# Patient Record
Sex: Female | Born: 1960 | Race: Black or African American | Hispanic: No | Marital: Married | Smoking: Never smoker
Health system: Southern US, Community
[De-identification: ages and names within clinical notes are randomized; demographics above are authoritative.]

## PROBLEM LIST (undated history)

## (undated) DIAGNOSIS — Z923 Personal history of irradiation: Secondary | ICD-10-CM

## (undated) DIAGNOSIS — C50919 Malignant neoplasm of unspecified site of unspecified female breast: Secondary | ICD-10-CM

## (undated) DIAGNOSIS — C801 Malignant (primary) neoplasm, unspecified: Secondary | ICD-10-CM

## (undated) DIAGNOSIS — K219 Gastro-esophageal reflux disease without esophagitis: Secondary | ICD-10-CM

## (undated) DIAGNOSIS — I10 Essential (primary) hypertension: Secondary | ICD-10-CM

## (undated) HISTORY — DX: Personal history of irradiation: Z92.3

## (undated) HISTORY — PX: BREAST BIOPSY: SHX20

## (undated) HISTORY — PX: BREAST LUMPECTOMY: SHX2

## (undated) HISTORY — PX: BUNIONECTOMY: SHX129

## (undated) HISTORY — PX: ENDOMETRIAL ABLATION: SHX621

---

## 1999-03-18 ENCOUNTER — Other Ambulatory Visit: Admission: RE | Admit: 1999-03-18 | Discharge: 1999-03-18 | Payer: Self-pay | Admitting: *Deleted

## 2000-03-17 ENCOUNTER — Other Ambulatory Visit: Admission: RE | Admit: 2000-03-17 | Discharge: 2000-03-17 | Payer: Self-pay | Admitting: *Deleted

## 2001-04-01 ENCOUNTER — Other Ambulatory Visit: Admission: RE | Admit: 2001-04-01 | Discharge: 2001-04-01 | Payer: Self-pay | Admitting: *Deleted

## 2002-04-04 ENCOUNTER — Other Ambulatory Visit: Admission: RE | Admit: 2002-04-04 | Discharge: 2002-04-04 | Payer: Self-pay | Admitting: *Deleted

## 2003-12-27 ENCOUNTER — Other Ambulatory Visit: Admission: RE | Admit: 2003-12-27 | Discharge: 2003-12-27 | Payer: Self-pay | Admitting: Obstetrics and Gynecology

## 2005-01-21 ENCOUNTER — Emergency Department (HOSPITAL_COMMUNITY): Admission: EM | Admit: 2005-01-21 | Discharge: 2005-01-21 | Payer: Self-pay | Admitting: Emergency Medicine

## 2006-01-15 ENCOUNTER — Encounter: Admission: RE | Admit: 2006-01-15 | Discharge: 2006-01-15 | Payer: Self-pay | Admitting: Obstetrics and Gynecology

## 2006-12-01 ENCOUNTER — Ambulatory Visit (HOSPITAL_COMMUNITY): Admission: RE | Admit: 2006-12-01 | Discharge: 2006-12-01 | Payer: Self-pay | Admitting: Obstetrics and Gynecology

## 2006-12-01 ENCOUNTER — Encounter (INDEPENDENT_AMBULATORY_CARE_PROVIDER_SITE_OTHER): Payer: Self-pay | Admitting: *Deleted

## 2007-02-10 ENCOUNTER — Encounter: Admission: RE | Admit: 2007-02-10 | Discharge: 2007-02-10 | Payer: Self-pay | Admitting: Family Medicine

## 2007-06-30 ENCOUNTER — Observation Stay (HOSPITAL_COMMUNITY): Admission: EM | Admit: 2007-06-30 | Discharge: 2007-06-30 | Payer: Self-pay | Admitting: *Deleted

## 2008-02-28 ENCOUNTER — Encounter: Admission: RE | Admit: 2008-02-28 | Discharge: 2008-02-28 | Payer: Self-pay | Admitting: Obstetrics and Gynecology

## 2009-04-26 ENCOUNTER — Emergency Department (HOSPITAL_COMMUNITY): Admission: EM | Admit: 2009-04-26 | Discharge: 2009-04-26 | Payer: Self-pay | Admitting: Emergency Medicine

## 2009-05-08 ENCOUNTER — Encounter: Admission: RE | Admit: 2009-05-08 | Discharge: 2009-05-08 | Payer: Self-pay | Admitting: Family Medicine

## 2010-12-24 ENCOUNTER — Encounter
Admission: RE | Admit: 2010-12-24 | Discharge: 2010-12-24 | Payer: Self-pay | Source: Home / Self Care | Attending: Obstetrics | Admitting: Obstetrics

## 2011-04-29 NOTE — H&P (Signed)
Shelly Beasley, Shelly Beasley                 ACCOUNT NO.:  [id number]   MEDICAL RECORD NO.:  [id number]          PATIENT TYPE:  EMS   LOCATION:  ED                           FACILITY:  [facility]   PHYSICIAN:  Hollice Espy, M.D.DATE OF BIRTH:  Jul 28, 1961   DATE OF ADMISSION:  06/29/2007  DATE OF DISCHARGE:                              HISTORY & PHYSICAL   PRIMARY CARE PHYSICIAN:  Stacie Acres. White, M.D.   CHIEF COMPLAINT:  Chest pain.   HISTORY OF PRESENT ILLNESS:  Patient is a 50 year old African-American  female with essentially no past medical history, who presents to the  emergency room complaining of some chest pain.  She has had no previous  episodes, and this morning at approximately 10:30 to 11:00, she has  started having some problems with chest pain just over her right breast,  described as a combination of pressure and also some sharp pain.  It did  not effect her breathing.  It did not radiate.  It was about a 7/10.  It  did not go down her arm, go up to her neck, did not go to her back.  Again, it caused no associated breathing, no nausea or vomiting.  The  symptoms persisted all throughout the day during work and after they  persisted after work, she decided to come to the emergency room.  In the  emergency room, she had an EKG and enzymes checked, all of which were  unremarkable.  Chest x-ray showed no evidence of any ST-T wave changes.  Cardiac markers were unremarkable, as well.  However, the only thing  concerning was the patient's blood pressure, which has ranged during her  hospitalization anywhere from 155 to 181 systolic, to 95-118 diastolic.  The patient tells me that she has had no previous history of blood  pressure, although Dr. Cliffton Asters has been following her blood pressure in  the office and has said at times it has had its ups and downs.   She received some morphine which she said helped her a little bit.  She  noted no associated change with activity or position  but did notice that  when she used her right arm a lot, that seemed to change the pain in  nature.   Currently, she is doing well.  She complains of a mild headache which is  likely secondary to nitroglycerin that she received in the emergency  room but denies any visual changes or dysphagia.  She still has some  residual right-sided pain over her right breast but no palpitations, no  shortness of breath, wheezing, coughing.  No abdominal pain, no  hematuria, dysuria, constipation, diarrhea, focal extremity numbness,  weakness, or pain.  The review of systems is otherwise negative.   PAST MEDICAL HISTORY:  None.   MEDICATIONS:  She is on diethylpropion 75 mg p.o. daily for weight loss.  She takes no over-the-counter medications.  No multivitamins or [employer]  products.   She reports nausea to CODEINE, which causes nausea.   She denies any tobacco, alcohol, or illegal drug use.   FAMILY HISTORY:  Notable for diabetes and hypertension.   PHYSICAL EXAMINATION:  VITALS ON ADMISSION:  Temp 98.5, heart rate 81,  blood pressure 168/116.  Since then, her blood pressure has gone as high  as 181/104.  Last, it was 155/95.  Respirations were 20.  O2 sat were  99% on room air.  GENERAL:  Patient is alert and oriented x3.  No apparent distress.  HEENT:  Normocephalic and atraumatic.  Her mucous membranes are moist.  NECK:  She has no carotid bruits.  HEART:  Regular rate and rhythm.  S1 and S2.  LUNGS:  Clear to auscultation bilaterally.  ABDOMEN:  Soft, nontender, nondistended.  Positive bowel sounds.  EXTREMITIES:  No clubbing, cyanosis or edema.   LAB WORK:  EKG is reportedly a normal sinus rhythm.  No ST-T wave  elevation.   Sodium 136, potassium 2.5, chloride 104, bicarb 28, BUN 6, creatinine  0.8, glucose 89, white count 5.2.  H&H 13.4, 39.5.  MCV 84, platelet  count 211.  No shift differential.  A D-dimer is normal.  CPK 40.2, MB  less than 1.  Troponin I less than 0.05.   Chest  x-ray is unremarkable.   ASSESSMENT/PLAN:  1. Atypical chest pain:  The patient is of a young age, has no other      risk factors.  Given her history and physical exam, I would      normally be tempted to send her home but with her elevated blood      pressures and no previous history, I am going to at least observe      her overnight and check serial sets of cardiac markers.  We will      also check a urine drug screen.  If her cardiac markers stay within      normal limits and she has no further episodes, likely I will plan      to send her home with an outpatient stress test, although I will      defer to my hospitalist partner to do that.  2. Hypertension:  Will continue to follow.  I suspect some component      of her elevated blood pressures may be secondary to her dietary      supplement drug, which has been reported can cause tachycardia and      high blood pressure as well.  Nevertheless, we will plan to      continue to check her pressures and should they remain elevated,      would recommend that she hold off on her diet drug and have her      follow up with Dr. Cliffton Asters, who may want to start her on outpatient      blood pressure medication.  Again, will also order a urine drug      screen.      Hollice Espy, M.D.  Electronically Signed     SKK/MEDQ  D:  06/30/2007  T:  06/30/2007  Job:  [id number]   cc:   Stacie Acres. Cliffton Asters, M.D.  Fax: [phone]

## 2011-04-29 NOTE — Discharge Summary (Signed)
Shelly Beasley, Shelly Beasley                 ACCOUNT NO.:  [id number]   MEDICAL RECORD NO.:  [id number]          PATIENT TYPE:  INP   LOCATION:  1439                         [facility]ACILITY[facility]  [facility]   PHYSICIAN:  Barnetta Chapel, MDDATE OF BIRTH:  1961-02-27   DATE OF ADMISSION:  06/29/2007  DATE OF DISCHARGE:  06/30/2007                               DISCHARGE SUMMARY   PRIMARY CARE Lamiya Naas:  Stacie Acres. White, M.D.   DISCHARGE DIAGNOSES:  1. Chest pain, atypical, likely of musculoskeletal origin.  2. Hypertension.   DISCHARGE MEDICATIONS:  Zestoretic 20/25 one tablet p.o. once daily.   CONSULTATIONS DONE:  None.   BRIEF HISTORY AND HOSPITAL COURSE:  Please refer to the H&P done by Dr.  Hollice Espy, M.D. on June 29, 2007.  The patient is a 50 year old  female with no significant past medical history.  The patient presented  with atypical chest pain. He said it to be right sided.  It had no  radiation.  The patient does not have any significant risk factors for  ACS.  On admission, the patient's blood pressure was noted to be on the  high side.  The patient takes a diet pill, Diethylpropion.   The patient was admitted for observation.  The cardiac enzymes were  cycled.  The cardiac enzymes all came back negative.  The plan is for  the patient to have an outpatient cardiac stress test.  The chest pain  has also resolved.  The patient's blood pressure on discharge was 149/97  mmHg.  She will be discharged home on Zestoretic 20/25 one tablet p.o.  once daily.   DISCHARGE PLANS:  1. Send patient home on the above medications.  2. The patient to start taking the diet pill.  3. Cardiac diet.  4. Activity as tolerated.  5. Outpatient cardiac stress test.      Barnetta Chapel, MD  Electronically Signed     SIO/MEDQ  D:  06/30/2007  T:  06/30/2007  Job:  [id number]   cc:   Stacie Acres. Cliffton Asters, M.D.  Fax: [phone]

## 2011-05-02 NOTE — Op Note (Signed)
NAMEBRITT, Shelly Beasley                 ACCOUNT NO.:  [id number]   MEDICAL RECORD NO.:  [id number]          PATIENT TYPE:  AMB   LOCATION:  SDC                           FACILITY:  [facility]   PHYSICIAN:  Richardean Sale, M.D.   DATE OF BIRTH:  08/13/1961   DATE OF PROCEDURE:  12/01/2006  DATE OF DISCHARGE:                               OPERATIVE REPORT   PREOPERATIVE DIAGNOSIS:  Menorrhagia.   POSTOPERATIVE DIAGNOSIS:  Menorrhagia.   PROCEDURE:  Hysteroscopy D&C and NovaSure endometrial ablation.   SURGEON:  Dr. Richardean Sale   ASSISTANT:  None.   ANESTHESIA:  General and local.   COMPLICATIONS:  None.   BLOOD LOSS:  Minimal.   FLUID DEFICIT:  20 mL.   SPECIMENS:  Endometrial curettings sent to pathology.   FINDINGS:  Normal-appearing endometrial cavity, uterine sound length 10  cm, cervical length 5 cm, cavity length 5 cm, cavity width 3.5 cm, power  96, time 2 minutes.   INDICATIONS:  This is a 50 year old gravida 3, para 62 African American  female with menorrhagia who presents today for hysteroscopy D&C and  NovaSure endometrial ablation.  The patient has had endometrial biopsy  which was negative and a normal sonohysterogram. Husband has had  vasectomy. Prior to procedure risks, benefits and alternatives of the  procedure reviewed the patient in detail. We discussed the risks which  include but not limited to hemorrhage requiring transfusion, infection,  injury to the uterine cavity such as perforation which could injure the  bowel or bladder and require additional surgery, reviewed possibly of  DVT and pulmonary embolus and general surgical risks.  The patient  voiced understanding of above and desires to proceed.  Informed consent  was obtained and all questions answered.   PROCEDURE:  The patient was taken to the operating room where she was  given a general anesthetic.  She was then prepped and draped in the  usual sterile fashion, placed in the dorsal lithotomy  position.  Betadine prep was used. A red rubber catheter was then used to drain the  bladder.  Bimanual exam was performed.  The uterus was midline, mobile,  no obvious masses. Adnexa were normal. Speculum was placed in the  vagina.  The cervix was easily visualized and was grasped with single-  tooth tenaculum.  Paracervical block was administered using a total of  20 mL of 1% plain Nesacaine. Uterus then sounded 10 cm. The cervical  length was measured at 5 cm using Hegar dilator.  The Hegar dilator was  then used to gradually dilate the cervix and the hysteroscope was  introduced.  The uterine cavity was normal with both tubal ostia easily  visualized.  There were no fibroids or polyps or suspicious areas noted.  The hysteroscope was then removed and a sharp curettage was performed  and the specimen was then sent to pathology labeled as endometrial  curetting. The NovaSure device was then inserted after the appropriate  measurements were set on the NovaSure computer. The device was then  seated and the cavity width was 3.5 cm. The CO2 test  was then performed  and was passed. The ablation was then performed for a total of 2 minutes  with power at 96. The NovaSure device was then removed and the  hysteroscope was reintroduced with a thorough ablation of the  endometrial cavity. Fluid deficit was 20 mL, no evidence of any  perforation.  The instruments were then removed from the patient's  vagina and the procedure was terminated.  The tenaculum site was  hemostatic.  There was no bleeding coming from the cervical os.  The  patient was then taken out of dorsal lithotomy position and was  transferred to the recovery awake in stable condition.  There were no  complications.  All sponge, lap, needle and instrument counts were  correct x2.      Richardean Sale, M.D.  Electronically Signed     JW/MEDQ  D:  12/01/2006  T:  12/01/2006  Job:  [id number]

## 2011-09-29 LAB — POCT CARDIAC MARKERS
CKMB, poc: <1 — ABNORMAL LOW
Myoglobin, poc: 40.2

## 2011-09-29 LAB — DIFFERENTIAL
Basophils Relative: 0
Lymphocytes Relative: 36
Lymphs Abs: 1.9
Monocytes Relative: 8
Neutrophils Relative %: 53

## 2011-09-29 LAB — RAPID URINE DRUG SCREEN, HOSP PERFORMED
Amphetamines: NOT DETECTED
Barbiturates: NOT DETECTED
Opiates: NOT DETECTED
Tetrahydrocannabinol: NOT DETECTED

## 2011-09-29 LAB — BASIC METABOLIC PANEL
CO2: 28
Chloride: 104
GFR calc Af Amer: >60
Sodium: 136

## 2011-09-29 LAB — CBC
HCT: 39.5
Hemoglobin: 13.4
MCV: 84
RBC: 4.7
RDW: 14.1 — ABNORMAL HIGH

## 2011-09-29 LAB — CARDIAC PANEL(CRET KIN+CKTOT+MB+TROPI)
CK, MB: 1
Relative Index: 0.9
Relative Index: INVALID
Troponin I: 0.01

## 2012-07-06 ENCOUNTER — Other Ambulatory Visit: Payer: Self-pay | Admitting: Family Medicine

## 2012-07-06 DIAGNOSIS — Z1231 Encounter for screening mammogram for malignant neoplasm of breast: Secondary | ICD-10-CM

## 2012-07-19 ENCOUNTER — Ambulatory Visit: Payer: Self-pay

## 2012-07-26 ENCOUNTER — Ambulatory Visit
Admission: RE | Admit: 2012-07-26 | Discharge: 2012-07-26 | Disposition: A | Discharge status: Home / Self Care | Payer: BC Managed Care – PPO | Source: Ambulatory Visit | Attending: Family Medicine | Admitting: Family Medicine

## 2012-07-26 DIAGNOSIS — Z1231 Encounter for screening mammogram for malignant neoplasm of breast: Secondary | ICD-10-CM

## 2012-11-12 ENCOUNTER — Emergency Department (HOSPITAL_COMMUNITY): Payer: BC Managed Care – PPO

## 2012-11-12 ENCOUNTER — Encounter (HOSPITAL_COMMUNITY): Payer: Self-pay | Admitting: *Deleted

## 2012-11-12 ENCOUNTER — Emergency Department (HOSPITAL_COMMUNITY)
Admission: EM | Admit: 2012-11-12 | Discharge: 2012-11-12 | Disposition: A | Discharge status: Home / Self Care | Payer: BC Managed Care – PPO | Attending: Emergency Medicine | Admitting: Emergency Medicine

## 2012-11-12 DIAGNOSIS — S60229A Contusion of unspecified hand, initial encounter: Secondary | ICD-10-CM | POA: Insufficient documentation

## 2012-11-12 DIAGNOSIS — Z79899 Other long term (current) drug therapy: Secondary | ICD-10-CM | POA: Insufficient documentation

## 2012-11-12 DIAGNOSIS — I10 Essential (primary) hypertension: Secondary | ICD-10-CM | POA: Insufficient documentation

## 2012-11-12 DIAGNOSIS — Y9389 Activity, other specified: Secondary | ICD-10-CM | POA: Insufficient documentation

## 2012-11-12 HISTORY — DX: Essential (primary) hypertension: I10
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
## 2013-12-13 ENCOUNTER — Other Ambulatory Visit: Payer: Self-pay

## 2013-12-13 DIAGNOSIS — Z1231 Encounter for screening mammogram for malignant neoplasm of breast: Secondary | ICD-10-CM

## 2013-12-16 ENCOUNTER — Ambulatory Visit: Payer: BC Managed Care – PPO

## 2013-12-16 ENCOUNTER — Ambulatory Visit
Admission: RE | Admit: 2013-12-16 | Discharge: 2013-12-16 | Disposition: A | Discharge status: Home / Self Care | Payer: BC Managed Care – PPO | Source: Ambulatory Visit

## 2013-12-16 DIAGNOSIS — Z1231 Encounter for screening mammogram for malignant neoplasm of breast: Secondary | ICD-10-CM

## 2014-04-28 ENCOUNTER — Encounter: Payer: Self-pay | Admitting: Podiatrist

## 2014-04-28 ENCOUNTER — Ambulatory Visit (INDEPENDENT_AMBULATORY_CARE_PROVIDER_SITE_OTHER): Payer: BC Managed Care – PPO

## 2014-04-28 ENCOUNTER — Ambulatory Visit (INDEPENDENT_AMBULATORY_CARE_PROVIDER_SITE_OTHER): Payer: BC Managed Care – PPO | Admitting: Podiatrist

## 2014-04-28 VITALS — Ht 65.0 in | Wt 160.0 lb

## 2014-04-28 DIAGNOSIS — M722 Plantar fascial fibromatosis: Secondary | ICD-10-CM

## 2014-04-28 MED ORDER — TRIAMCINOLONE ACETONIDE 40 MG/ML IJ SUSP
20.0000 mg | Freq: Once | INTRAMUSCULAR | Status: AC
  Administered 2014-04-28: 20 mg

## 2014-04-28 NOTE — Patient Instructions (Addendum)

## 2014-04-28 NOTE — Progress Notes (Signed)
   Subjective:    Patient ID: Shelly Beasley, female    DOB: January 07, 1961, 53 y.o.   MRN: [id number]  HPI Comments: Bottom of the left heel has been hurting . It is not constant. But it seems to hurt after sitting for a while and getting up. Tender to press in the heel area. It has been going on and off since June 2014  Foot Pain      Review of Systems  All other systems reviewed and are negative.      Objective:   Physical Exam GENERAL APPEARANCE: Alert, conversant. Appropriately groomed. No acute distress.  VASCULAR: Pedal pulses palpable and strong bilateral.  Capillary refill time is immediate to all digits,  Proximal to distal cooling it warm to warm.  Digital hair growth is present bilateral  NEUROLOGIC: sensation is intact epicritically and protectively to 5.07 monofilament at 5/5 sites bilateral.  Light touch is intact bilateral, vibratory sensation intact bilateral, achilles tendon reflex is intact bilateral.  Negative Tinel sign is elicited MUSCULOSKELETAL: Pain on palpation plantar medial aspect left foot  at insertion of plantar fascia on the medial calcaneal tubercle. Inflammation at the insertion of the plantar fascia is present. 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 DERMATOLOGIC: skin color, texture, and turger are within normal limits.  No preulcerative lesions are seen, no interdigital maceration noted.  No open lesions present.  Digital nails are asymptomatic.     Assessment & Plan:  Mild plantar fasciitis left Plan: Injected the plantar fascia with Kenalog and Marcaine mixture under sterile technique without complication. A removable plantar fascial taping was dispensed. Recommended shoe gear changes and she will wear her good supportive sneakers for at least 2 weeks or until the pain resolves. Stretching exercises were also dispensed. She will be seen back when necessary

## 2014-06-08 ENCOUNTER — Emergency Department (INDEPENDENT_AMBULATORY_CARE_PROVIDER_SITE_OTHER): Payer: Worker's Compensation

## 2014-06-08 ENCOUNTER — Emergency Department (INDEPENDENT_AMBULATORY_CARE_PROVIDER_SITE_OTHER)
Admission: EM | Admit: 2014-06-08 | Discharge: 2014-06-08 | Disposition: A | Discharge status: Home / Self Care | Payer: Worker's Compensation | Source: Home / Self Care | Attending: Family Medicine | Admitting: Family Medicine

## 2014-06-08 ENCOUNTER — Encounter (HOSPITAL_COMMUNITY): Payer: Self-pay | Admitting: Emergency Medicine

## 2014-06-08 DIAGNOSIS — Y990 Civilian activity done for income or pay: Secondary | ICD-10-CM

## 2014-06-08 DIAGNOSIS — S8000XA Contusion of unspecified knee, initial encounter: Secondary | ICD-10-CM

## 2014-06-08 DIAGNOSIS — IMO0002 Reserved for concepts with insufficient information to code with codable children: Secondary | ICD-10-CM

## 2014-06-08 DIAGNOSIS — S8001XA Contusion of right knee, initial encounter: Secondary | ICD-10-CM

## 2014-06-08 NOTE — Discharge Instructions (Signed)
Thank you for coming in today. Take ibuprofen or aleve as needed.  Return as needed.    Contusion A contusion is the result of an injury to the skin and underlying tissues and is usually caused by direct trauma. The injury results in the appearance of a bruise on the skin overlying the injured tissues. Contusions cause rupture and bleeding of the small capillaries and blood vessels and affect function, because the bleeding infiltrates muscles, tendons, nerves, or other soft tissues.  SYMPTOMS   Swelling and often a hard lump in the injured area, either superficial or deep.  Pain and tenderness over the area of the contusion.  Feeling of firmness when pressure is exerted over the contusion.  Discoloration under the skin, beginning with redness and progressing to the characteristic "black and blue" bruise. CAUSES  A contusion is typically the result of direct trauma. This is often by a blunt object.  RISK INCREASES WITH:  Sports that have a high likelihood of trauma (football, boxing, ice hockey, soccer, field hockey, martial arts, basketball, and baseball).  Sports that make falling from a height likely (high-jumping, pole-vaulting, skating, or gymnastics).  Any bleeding disorder (hemophilia) or taking medications that affect clotting (aspirin, nonsteroidal anti-inflammatory medications, or warfarin [Coumadin]).  Inadequate protection of exposed areas during contact sports. PREVENTION  Maintain physical fitness:  Joint and muscle flexibility.  Strength and endurance.  Coordination.  Wear proper protective equipment. Make sure it fits correctly. PROGNOSIS  Contusions typically heal without any complications. Healing time varies with the severity of injury and intake of medications that affect clotting. Contusions usually heal in 1 to 4 weeks. RELATED COMPLICATIONS   Damage to nearby nerves or blood vessels, causing numbness, coldness, or paleness.  Compartment  syndrome.  Bleeding into the soft tissues that leads to disability.  Infiltrative-type bleeding, leading to the calcification and impaired function of the injured muscle (rare).  Prolonged healing time if usual activities are resumed too soon.  Infection if the skin over the injury site is broken.  Fracture of the bone underlying the contusion.  Stiffness in the joint where the injured muscle crosses. TREATMENT  Treatment initially consists of resting the injured area as well as medication and ice to reduce inflammation. The use of a compression bandage may also be helpful in minimizing inflammation. As pain diminishes and movement is tolerated, the joint where the affected muscle crosses should be moved to prevent stiffness and the shortening (contracture) of the joint. Movement of the joint should begin as soon as possible. It is also important to work on maintaining strength within the affected muscles. Occasionally, extra padding over the area of contusion may be recommended before returning to sports, particularly if re-injury is likely.  MEDICATION   If pain relief is necessary these medications are often recommended:  Nonsteroidal anti-inflammatory medications, such as aspirin and ibuprofen.  Other minor pain relievers, such as acetaminophen, are often recommended.  Prescription pain relievers may be given by your caregiver. Use only as directed and only as much as you need. HEAT AND COLD  Cold treatment (icing) relieves pain and reduces inflammation. Cold treatment should be applied for 10 to 15 minutes every 2 to 3 hours for inflammation and pain and immediately after any activity that aggravates your symptoms. Use ice packs or an ice massage. (To do an ice massage fill a large styrofoam cup with water and freeze. Tear a small amount of foam from the top so ice protrudes. Massage ice firmly over the  injured area in a circle about the size of a softball.)  Heat treatment may be  used prior to performing the stretching and strengthening activities prescribed by your caregiver, physical therapist, or athletic trainer. Use a heat pack or a warm soak. SEEK MEDICAL CARE IF:   Symptoms get worse or do not improve despite treatment in a few days.  You have difficulty moving a joint.  Any extremity becomes extremely painful, numb, pale, or cool (This is an emergency!).  Medication produces any side effects (bleeding, upset stomach, or allergic reaction).  Signs of infection (drainage from skin, headache, muscle aches, dizziness, fever, or general ill feeling) occur if skin was broken. Document Released: 12/01/2005 Document Revised: 02/23/2012 Document Reviewed: 03/15/2009 [facility] Patient Information 2015 [place], [place]. This information is not intended to replace advice given to you by your health care provider. Make sure you discuss any questions you have with your health care provider.

## 2014-06-08 NOTE — ED Provider Notes (Signed)
Shelly Beasley is a 53 y.o. female who presents to Urgent Care today for right knee pain. Patient had a large heavy object fall on her right knee today at work. She notes pain and swelling at the superior patella. She notes pain with motion. She is able to ambulate. She denies any radiating pain weakness or numbness. No medications tried yet.   Past Medical History  Diagnosis Date  . Hypertension    History  Substance Use Topics  . Smoking status: Never Smoker   . Smokeless tobacco: Not on file  . Alcohol Use: No   ROS as above Medications: No current facility-administered medications for this encounter.   Current Outpatient Prescriptions  Medication Sig Dispense Refill  . Cholecalciferol (VITAMIN D3) 2000 UNITS TABS Take by mouth.      . olmesartan-hydrochlorothiazide (BENICAR HCT) 40-12.5 MG per tablet Take 1 tablet by mouth daily.      Marland Kitchen omeprazole (PRILOSEC) 40 MG capsule         Exam:  BP 134/80  Pulse 67  Temp(Src) 98.1 F (36.7 C) (Oral)  Resp 16  SpO2 98% Gen: Well NAD Right knee: Ecchymosis and mild swelling anterior. Tender palpation superior patella. Decreased range of motion flexion because of pain to about 90. Normal extension. Strength is intact with extension. Stable ligamentous exam Antalgic gait Peripheral pulses and sensation are intact distally  No results found for this or any previous visit (from the past 24 hour(s)). Dg Knee Complete 4 Views Right  06/08/2014   CLINICAL DATA:  Right knee pain  EXAM: RIGHT KNEE - COMPLETE 4+ VIEW  COMPARISON:  None.  FINDINGS: There is no evidence of fracture, dislocation, or joint effusion. There are tiny tricompartmental marginal osteophytes. Soft tissues are unremarkable.  IMPRESSION: No acute osseous injury of the right knee.   Electronically Signed   By: Kathreen Devoid   On: 06/08/2014 19:45    Assessment and Plan: 53 y.o. female with knee contusion. NSAIDs for pain control. Followup with orthopedics as  needed.  Discussed warning signs or symptoms. Please see discharge instructions. Patient expresses understanding.    Gregor Hams, MD 06/08/14 2059

## 2014-06-08 NOTE — ED Notes (Signed)
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

## 2014-12-27 ENCOUNTER — Other Ambulatory Visit: Payer: Self-pay

## 2014-12-27 DIAGNOSIS — Z1231 Encounter for screening mammogram for malignant neoplasm of breast: Secondary | ICD-10-CM

## 2015-01-04 ENCOUNTER — Ambulatory Visit: Payer: Self-pay

## 2015-01-04 ENCOUNTER — Ambulatory Visit
Admission: RE | Admit: 2015-01-04 | Discharge: 2015-01-04 | Disposition: A | Discharge status: Home / Self Care | Payer: BC Managed Care – PPO | Source: Ambulatory Visit

## 2015-01-04 DIAGNOSIS — Z1231 Encounter for screening mammogram for malignant neoplasm of breast: Secondary | ICD-10-CM

## 2015-04-10 ENCOUNTER — Other Ambulatory Visit: Payer: Self-pay | Admitting: Family Medicine

## 2015-04-10 DIAGNOSIS — M7989 Other specified soft tissue disorders: Secondary | ICD-10-CM

## 2015-04-10 DIAGNOSIS — M799 Soft tissue disorder, unspecified: Secondary | ICD-10-CM

## 2015-04-12 ENCOUNTER — Ambulatory Visit
Admission: RE | Admit: 2015-04-12 | Discharge: 2015-04-12 | Disposition: A | Discharge status: Home / Self Care | Payer: BC Managed Care – PPO | Source: Ambulatory Visit | Attending: Family Medicine | Admitting: Family Medicine

## 2015-04-12 ENCOUNTER — Other Ambulatory Visit: Payer: Self-pay

## 2015-04-12 DIAGNOSIS — M7989 Other specified soft tissue disorders: Secondary | ICD-10-CM

## 2015-12-09 ENCOUNTER — Encounter (HOSPITAL_COMMUNITY): Payer: Self-pay | Admitting: Emergency Medicine

## 2015-12-09 ENCOUNTER — Emergency Department (HOSPITAL_COMMUNITY): Payer: BC Managed Care – PPO

## 2015-12-09 DIAGNOSIS — R1013 Epigastric pain: Secondary | ICD-10-CM | POA: Insufficient documentation

## 2015-12-09 DIAGNOSIS — K219 Gastro-esophageal reflux disease without esophagitis: Secondary | ICD-10-CM | POA: Insufficient documentation

## 2015-12-09 DIAGNOSIS — I10 Essential (primary) hypertension: Secondary | ICD-10-CM | POA: Insufficient documentation

## 2015-12-09 DIAGNOSIS — Z79899 Other long term (current) drug therapy: Secondary | ICD-10-CM | POA: Insufficient documentation

## 2015-12-09 LAB — COMPREHENSIVE METABOLIC PANEL
ALBUMIN: 3.6 g/dL (ref 3.5–5.0)
ALT: 21 U/L (ref 14–54)
ANION GAP: 8 (ref 5–15)
AST: 31 U/L (ref 15–41)
Alkaline Phosphatase: 80 U/L (ref 38–126)
BUN: 19 mg/dL (ref 6–20)
CO2: 27 mmol/L (ref 22–32)
Calcium: 9.6 mg/dL (ref 8.9–10.3)
Chloride: 103 mmol/L (ref 101–111)
Creatinine, Ser: 1.09 mg/dL — ABNORMAL HIGH (ref 0.44–1.00)
GFR calc non Af Amer: 57 mL/min — ABNORMAL LOW (ref >60)
GLUCOSE: 140 mg/dL — AB (ref 65–99)
POTASSIUM: 3.9 mmol/L (ref 3.5–5.1)
SODIUM: 138 mmol/L (ref 135–145)
Total Bilirubin: 0.5 mg/dL (ref 0.3–1.2)
Total Protein: 6.7 g/dL (ref 6.5–8.1)

## 2015-12-09 LAB — I-STAT TROPONIN, ED: Troponin i, poc: 0.02 ng/mL (ref 0.00–0.08)

## 2015-12-09 LAB — URINALYSIS, ROUTINE W REFLEX MICROSCOPIC
Bilirubin Urine: NEGATIVE
GLUCOSE, UA: NEGATIVE mg/dL
HGB URINE DIPSTICK: NEGATIVE
KETONES UR: NEGATIVE mg/dL
Leukocytes, UA: NEGATIVE
NITRITE: NEGATIVE
PROTEIN: NEGATIVE mg/dL
Specific Gravity, Urine: 1.024 (ref 1.005–1.030)
pH: 5.5 (ref 5.0–8.0)

## 2015-12-09 LAB — CBC
HCT: 41 % (ref 36.0–46.0)
Hemoglobin: 13.4 g/dL (ref 12.0–15.0)
MCH: 28.2 pg (ref 26.0–34.0)
MCHC: 32.7 g/dL (ref 30.0–36.0)
MCV: 86.3 fL (ref 78.0–100.0)
Platelets: 256 10*3/uL (ref 150–400)
RBC: 4.75 MIL/uL (ref 3.87–5.11)
RDW: 13.6 % (ref 11.5–15.5)
WBC: 5.3 10*3/uL (ref 4.0–10.5)

## 2015-12-09 LAB — LIPASE, BLOOD: Lipase: 43 U/L (ref 11–51)

## 2015-12-09 NOTE — ED Notes (Signed)
C/o sharp intermittent mid upper abd pain with sob since 6:30pm.  Denies nausea and vomiting.  History of acid reflux but states this feels different.

## 2015-12-10 ENCOUNTER — Emergency Department (HOSPITAL_COMMUNITY)
Admission: EM | Admit: 2015-12-10 | Discharge: 2015-12-10 | Disposition: A | Discharge status: Home / Self Care | Payer: BC Managed Care – PPO | Attending: Emergency Medicine | Admitting: Emergency Medicine

## 2015-12-10 DIAGNOSIS — R1013 Epigastric pain: Secondary | ICD-10-CM

## 2015-12-10 HISTORY — DX: Gastro-esophageal reflux disease without esophagitis: K21.9

## 2015-12-10 NOTE — ED Provider Notes (Signed)
CSN: [id number]     Arrival date & time 12/09/15  2219 History  By signing my name below, I, Sonum Patel, attest that this documentation has been prepared under the direction and in the presence of Merryl Hacker, MD. Electronically Signed: Sonum Patel, Education administrator. 12/10/2015. 1:48 AM.    Chief Complaint  Patient presents with  . Abdominal Pain    The history is provided by the patient. No language interpreter was used.     HPI Comments: Shelly Beasley is a 54 y.o. female with past medical history of GERD who presents to the Emergency Department complaining of intermittent, gradually improved epigastric abdominal pain that began several hours ago. She describes her pain as sharp in nature and states it is similar to prior episodes of GERD but in a different location. She denies abnormal food or increased food intake over the last day. She has not taken any pain medication for her symptoms. She denies nausea, vomiting, SOB, cough, diarrhea.   Past Medical History  Diagnosis Date  . Hypertension   . Acid reflux    History reviewed. No pertinent past surgical history. No family history on file. Social History  Substance Use Topics  . Smoking status: Never Smoker   . Smokeless tobacco: None  . Alcohol Use: No   OB History    No data available     Review of Systems  Constitutional: Negative for fever.  Respiratory: Negative for cough and shortness of breath.   Gastrointestinal: Positive for abdominal pain. Negative for nausea, vomiting and diarrhea.  Genitourinary: Negative for dysuria.  All other systems reviewed and are negative.     Allergies  Codeine  Home Medications   Prior to Admission medications   Medication Sig Start Date End Date Taking? Authorizing Provider  olmesartan-hydrochlorothiazide (BENICAR HCT) 40-12.5 MG per tablet Take 1 tablet by mouth daily.   Yes Historical Provider, MD  omeprazole (PRILOSEC) 40 MG capsule Take 40 mg by mouth daily.  03/17/14  Yes  Historical Provider, MD   BP 115/82 mmHg  Pulse 75  Temp(Src) 98.2 F (36.8 C) (Oral)  Resp 16  SpO2 100% Physical Exam  Constitutional: She is oriented to person, place, and time. She appears well-developed and well-nourished. No distress.  HENT:  Head: Normocephalic and atraumatic.  Cardiovascular: Normal rate, regular rhythm and normal heart sounds.   Pulmonary/Chest: Effort normal and breath sounds normal. No respiratory distress. She has no wheezes.  Abdominal: Soft. Bowel sounds are normal. There is no tenderness. There is no rebound and no guarding.  Neurological: She is alert and oriented to person, place, and time.  Skin: Skin is warm and dry.  Psychiatric: She has a normal mood and affect.  Nursing note and vitals reviewed.   ED Course  Procedures (including critical care time)  DIAGNOSTIC STUDIES: Oxygen Saturation is 100% on RA, normal by my interpretation.    COORDINATION OF CARE: 1:52 AM Discussed treatment plan with pt at bedside and pt agreed to plan.   Labs Review Labs Reviewed  COMPREHENSIVE METABOLIC PANEL - Abnormal; Notable for the following:    Glucose, Bld 140 (*)    Creatinine, Ser 1.09 (*)    GFR calc non Af Amer 57 (*)    All other components within normal limits  LIPASE, BLOOD  CBC  URINALYSIS, ROUTINE W REFLEX MICROSCOPIC (NOT AT [facility])  Randolm Idol, ED    Imaging Review Dg Chest 2 View  12/09/2015  CLINICAL DATA:  54 year old female with  chest pain EXAM: CHEST  2 VIEW COMPARISON:  Chest radiograph dated 06/29/2007 do FINDINGS: Two views of the chest demonstrate minimal linear platelike atelectatic changes of the left lung base. No focal consolidation, pleural effusion, or pneumothorax. The cardiac silhouette is within normal limits. The osseous structures appear grossly unremarkable. IMPRESSION: No active cardiopulmonary disease. Electronically Signed   By: Anner Crete M.D.   On: 12/09/2015 22:59   I have personally reviewed and  evaluated these images and lab results as part of my medical decision-making.   EKG Interpretation   Date/Time:  Sunday December 09 2015 22:21:16 EST Ventricular Rate:  82 PR Interval:  162 QRS Duration: 78 QT Interval:  394 QTC Calculation: 460 R Axis:   19 Text Interpretation:  Normal sinus rhythm Normal ECG Confirmed by Adriann Ballweg   MD, Retina Bernardy ([id number]) on 12/10/2015 1:03:15 AM      MDM   Final diagnoses:  Epigastric abdominal pain    Patient presents with abdominal pain.  Improved and back to baseline on my evaluation.  VS reassuring and exam is benign.  Basic labwork reassuring and EKG non-ischemic.  Patient tolerating PO with pain or recurrence of symptoms.  ?GERD.  DOubt acute emergent condition.  After history, exam, and medical workup I feel the patient has been appropriately medically screened and is safe for discharge home. Pertinent diagnoses were discussed with the patient. Patient was given return precautions.  I personally performed the services described in this documentation, which was scribed in my presence. The recorded information has been reviewed and is accurate.    Merryl Hacker, MD 12/10/15 1310

## 2015-12-10 NOTE — Discharge Instructions (Signed)

## 2015-12-25 ENCOUNTER — Other Ambulatory Visit: Payer: Self-pay

## 2015-12-25 DIAGNOSIS — Z1231 Encounter for screening mammogram for malignant neoplasm of breast: Secondary | ICD-10-CM

## 2016-01-15 ENCOUNTER — Ambulatory Visit
Admission: RE | Admit: 2016-01-15 | Discharge: 2016-01-15 | Disposition: A | Discharge status: Home / Self Care | Payer: BC Managed Care – PPO | Source: Ambulatory Visit

## 2016-01-15 DIAGNOSIS — Z1231 Encounter for screening mammogram for malignant neoplasm of breast: Secondary | ICD-10-CM

## 2016-01-17 ENCOUNTER — Other Ambulatory Visit: Payer: Self-pay | Admitting: Family Medicine

## 2016-01-17 DIAGNOSIS — R928 Other abnormal and inconclusive findings on diagnostic imaging of breast: Secondary | ICD-10-CM

## 2016-01-23 ENCOUNTER — Ambulatory Visit
Admission: RE | Admit: 2016-01-23 | Discharge: 2016-01-23 | Disposition: A | Discharge status: Home / Self Care | Payer: BC Managed Care – PPO | Source: Ambulatory Visit | Attending: Family Medicine | Admitting: Family Medicine

## 2016-01-23 DIAGNOSIS — R928 Other abnormal and inconclusive findings on diagnostic imaging of breast: Secondary | ICD-10-CM

## 2016-12-17 ENCOUNTER — Other Ambulatory Visit: Payer: Self-pay | Admitting: Family Medicine

## 2016-12-17 DIAGNOSIS — Z1231 Encounter for screening mammogram for malignant neoplasm of breast: Secondary | ICD-10-CM

## 2017-01-16 ENCOUNTER — Ambulatory Visit
Admission: RE | Admit: 2017-01-16 | Discharge: 2017-01-16 | Disposition: A | Discharge status: Home / Self Care | Payer: BC Managed Care – PPO | Source: Ambulatory Visit | Attending: Family Medicine | Admitting: Family Medicine

## 2017-01-16 DIAGNOSIS — Z1231 Encounter for screening mammogram for malignant neoplasm of breast: Secondary | ICD-10-CM

## 2017-01-20 ENCOUNTER — Other Ambulatory Visit: Payer: Self-pay | Admitting: Family Medicine

## 2017-01-20 DIAGNOSIS — R928 Other abnormal and inconclusive findings on diagnostic imaging of breast: Secondary | ICD-10-CM

## 2017-01-23 ENCOUNTER — Other Ambulatory Visit: Payer: Self-pay | Admitting: Family Medicine

## 2017-01-23 ENCOUNTER — Ambulatory Visit
Admission: RE | Admit: 2017-01-23 | Discharge: 2017-01-23 | Disposition: A | Discharge status: Home / Self Care | Payer: BC Managed Care – PPO | Source: Ambulatory Visit | Attending: Family Medicine | Admitting: Family Medicine

## 2017-01-23 DIAGNOSIS — N6320 Unspecified lump in the left breast, unspecified quadrant: Secondary | ICD-10-CM

## 2017-01-23 DIAGNOSIS — R928 Other abnormal and inconclusive findings on diagnostic imaging of breast: Secondary | ICD-10-CM

## 2017-01-26 ENCOUNTER — Ambulatory Visit
Admission: RE | Admit: 2017-01-26 | Discharge: 2017-01-26 | Disposition: A | Discharge status: Home / Self Care | Payer: BC Managed Care – PPO | Source: Ambulatory Visit | Attending: Family Medicine | Admitting: Family Medicine

## 2017-01-26 ENCOUNTER — Other Ambulatory Visit: Payer: Self-pay | Admitting: Family Medicine

## 2017-01-26 DIAGNOSIS — R928 Other abnormal and inconclusive findings on diagnostic imaging of breast: Secondary | ICD-10-CM

## 2017-01-26 DIAGNOSIS — N6320 Unspecified lump in the left breast, unspecified quadrant: Secondary | ICD-10-CM

## 2017-01-28 ENCOUNTER — Telehealth: Payer: Self-pay | Admitting: *Deleted

## 2017-01-28 NOTE — Telephone Encounter (Signed)
Confirmed [facility] for 02/04/17 at 0815 .  Instructions and contact information given.

## 2017-02-03 ENCOUNTER — Other Ambulatory Visit: Payer: Self-pay | Admitting: *Deleted

## 2017-02-03 DIAGNOSIS — Z170 Estrogen receptor positive status [ER+]: Principal | ICD-10-CM

## 2017-02-03 DIAGNOSIS — C50412 Malignant neoplasm of upper-outer quadrant of left female breast: Secondary | ICD-10-CM

## 2017-02-04 ENCOUNTER — Encounter: Payer: Self-pay | Admitting: *Deleted

## 2017-02-04 ENCOUNTER — Other Ambulatory Visit: Payer: Self-pay | Admitting: *Deleted

## 2017-02-04 ENCOUNTER — Ambulatory Visit (HOSPITAL_BASED_OUTPATIENT_CLINIC_OR_DEPARTMENT_OTHER): Payer: BC Managed Care – PPO | Admitting: Oncology

## 2017-02-04 ENCOUNTER — Encounter: Payer: Self-pay | Admitting: Oncology

## 2017-02-04 ENCOUNTER — Ambulatory Visit: Payer: BC Managed Care – PPO | Attending: General Surgery | Admitting: Physical Therapy

## 2017-02-04 ENCOUNTER — Other Ambulatory Visit (HOSPITAL_BASED_OUTPATIENT_CLINIC_OR_DEPARTMENT_OTHER): Payer: BC Managed Care – PPO

## 2017-02-04 ENCOUNTER — Encounter: Payer: Self-pay | Admitting: Physical Therapy

## 2017-02-04 ENCOUNTER — Ambulatory Visit
Admission: RE | Admit: 2017-02-04 | Discharge: 2017-02-04 | Disposition: A | Discharge status: Home / Self Care | Payer: BC Managed Care – PPO | Source: Ambulatory Visit | Attending: Radiation Oncology | Admitting: Radiation Oncology

## 2017-02-04 VITALS — BP 135/81 | HR 87 | Temp 98.0°F | Resp 20 | Ht 65.0 in | Wt 180.4 lb

## 2017-02-04 DIAGNOSIS — I10 Essential (primary) hypertension: Secondary | ICD-10-CM | POA: Diagnosis not present

## 2017-02-04 DIAGNOSIS — Z170 Estrogen receptor positive status [ER+]: Principal | ICD-10-CM

## 2017-02-04 DIAGNOSIS — R293 Abnormal posture: Secondary | ICD-10-CM | POA: Diagnosis present

## 2017-02-04 DIAGNOSIS — C50412 Malignant neoplasm of upper-outer quadrant of left female breast: Secondary | ICD-10-CM | POA: Diagnosis present

## 2017-02-04 LAB — COMPREHENSIVE METABOLIC PANEL
ALK PHOS: 91 U/L (ref 40–150)
ALT: 16 U/L (ref 0–55)
AST: 16 U/L (ref 5–34)
Albumin: 3.9 g/dL (ref 3.5–5.0)
Anion Gap: 9 mEq/L (ref 3–11)
BUN: 14.1 mg/dL (ref 7.0–26.0)
CALCIUM: 10.3 mg/dL (ref 8.4–10.4)
CO2: 29 mEq/L (ref 22–29)
Chloride: 102 mEq/L (ref 98–109)
Creatinine: 1 mg/dL (ref 0.6–1.1)
EGFR: 75 mL/min/{1.73_m2} — AB (ref >90)
Glucose: 113 mg/dl (ref 70–140)
POTASSIUM: 3.6 meq/L (ref 3.5–5.1)
Sodium: 140 mEq/L (ref 136–145)
Total Bilirubin: 0.42 mg/dL (ref 0.20–1.20)
Total Protein: 7.3 g/dL (ref 6.4–8.3)

## 2017-02-04 LAB — CBC WITH DIFFERENTIAL/PLATELET
BASO%: 0.8 % (ref 0.0–2.0)
BASOS ABS: 0 10*3/uL (ref 0.0–0.1)
EOS%: 5.9 % (ref 0.0–7.0)
Eosinophils Absolute: 0.2 10*3/uL (ref 0.0–0.5)
HCT: 41 % (ref 34.8–46.6)
HGB: 13.5 g/dL (ref 11.6–15.9)
LYMPH%: 44.7 % (ref 14.0–49.7)
MCH: 27.6 pg (ref 25.1–34.0)
MCHC: 32.8 g/dL (ref 31.5–36.0)
MCV: 84.2 fL (ref 79.5–101.0)
MONO#: 0.3 10*3/uL (ref 0.1–0.9)
MONO%: 8 % (ref 0.0–14.0)
NEUT#: 1.7 10*3/uL (ref 1.5–6.5)
NEUT%: 40.6 % (ref 38.4–76.8)
Platelets: 244 10*3/uL (ref 145–400)
RBC: 4.87 10*6/uL (ref 3.70–5.45)
RDW: 13.8 % (ref 11.2–14.5)
WBC: 4.2 10*3/uL (ref 3.9–10.3)
lymph#: 1.9 10*3/uL (ref 0.9–3.3)

## 2017-02-04 NOTE — Patient Instructions (Signed)

## 2017-02-04 NOTE — Therapy (Signed)
[place], [place], [address] Phone: [phone]   Fax:  [phone]  Physical Therapy Evaluation  Patient Details  Name: Shelly Beasley MRN: [id number] Date of Birth: 1961-10-11 Referring Provider: Dr. Stark Klein  Encounter Date: 02/04/2017      PT End of Session - 02/04/17 1108    Visit Number 1   Number of Visits 1   PT Start Time 6237   PT Stop Time 1011  Also saw pt from [id number] for a total of 20 minutes   PT Time Calculation (min) 16 min   Activity Tolerance Patient tolerated treatment well   Behavior During Therapy [facility] for tasks assessed/performed      Past Medical History:  Diagnosis Date  . Acid reflux   . Hypertension     Past Surgical History:  Procedure Laterality Date  . BUNIONECTOMY Bilateral     There were no vitals filed for this visit.       Subjective Assessment - 02/04/17 1102    Subjective Patient reports she is here to be seen by her medical team for her newly diagnosed left breast cancer.   Patient is accompained by: Family member   Pertinent History Patient was diagnosed on 01/16/17 with left grade 1 invasive lobular carcinoma. It is located in the upper outer quadrant of her left breast and measures 1 cm. It is ER/PR positive and HER2 negative with a Ki67 of 70%.    Patient Stated Goals Reduce lymphedema risk and learn post op shoulder ROM HEP   Currently in Pain? No/denies            [facility] PT Assessment - 02/04/17 0001      Assessment   Medical Diagnosis Left breast cancer   Referring Provider Dr. Stark Klein   Onset Date/Surgical Date 01/16/17   Hand Dominance Right   Prior Therapy none     Precautions   Precautions Other (comment)   Precaution Comments active cancer     Restrictions   Weight Bearing Restrictions No     Balance Screen   Has the patient fallen in the past 6 months Yes   How many times? 1  Fell at work; accident and not  balance related   Has the patient had a decrease in activity level because of a fear of falling?  No   Is the patient reluctant to leave their home because of a fear of falling?  No     Home Social worker Private residence   Living Arrangements Spouse/significant other;Children  Husband and 79, 40, and 63 y.o. children   Available Help at Discharge Family     Prior Function   Level of Independence Independent   Vocation Full time employment   Administrator, Civil Service; does computer work mostly   Leisure [employer] 3x/week for 1.5 hours     Cognition   Overall Cognitive Status Within Functional Limits for tasks assessed     Posture/Postural Control   Posture/Postural Control Postural limitations   Postural Limitations Forward head;Rounded Shoulders     ROM / Strength   AROM / PROM / Strength AROM;Strength     AROM   AROM Assessment Site Shoulder;Cervical   Right/Left Shoulder Right;Left   Right Shoulder Extension 54 Degrees   Right Shoulder Flexion 159 Degrees   Right Shoulder ABduction 156 Degrees   Right Shoulder Internal Rotation 60 Degrees   Right Shoulder External Rotation 85 Degrees  Left Shoulder Extension 57 Degrees   Left Shoulder Flexion 162 Degrees   Left Shoulder ABduction 165 Degrees   Left Shoulder Internal Rotation 79 Degrees   Left Shoulder External Rotation 80 Degrees   Cervical Flexion WNL   Cervical Extension WNL   Cervical - Right Side Bend WNL   Cervical - Left Side Bend WNL   Cervical - Right Rotation WNL   Cervical - Left Rotation WNL     Strength   Overall Strength Within functional limits for tasks performed      Patient was instructed today in a home exercise program today for post op shoulder range of motion. These included active assist shoulder flexion in sitting, scapular retraction, wall walking with shoulder abduction, and hands behind head external rotation.  She was encouraged to do these twice a day,  holding 3 seconds and repeating 5 times when permitted by her physician.          PT Education - 02/04/17 1108    Education provided Yes   Education Details Lymphedema risk reduction and post op shoulder ROM HEP   Person(s) Educated Patient;Spouse   Methods Explanation;Demonstration;Handout   Comprehension Verbalized understanding;Returned demonstration              Breast Clinic Goals - 02/04/17 1114      Patient will be able to verbalize understanding of pertinent lymphedema risk reduction practices relevant to her diagnosis specifically related to skin care.   Time 1   Period Days   Status Achieved     Patient will be able to return demonstrate and/or verbalize understanding of the post-op home exercise program related to regaining shoulder range of motion.   Time 1   Period Days   Status Achieved     Patient will be able to verbalize understanding of the importance of attending the postoperative After Breast Cancer Class for further lymphedema risk reduction education and therapeutic exercise.   Time 1   Period Days   Status Achieved              Plan - 02/04/17 1109    Clinical Impression Statement Patient was diagnosed on 01/16/17 with left grade 1 invasive lobular carcinoma. It is located in the upper outer quadrant of her left breast and measures 1 cm. It is ER/PR positive and HER2 negative with a Ki67 of 70%. Her multidisciplinary medical team met prior to her assessment to determine a recommended treatment plan. She is planning to have a left lumpectomy and sentinel node biopsy followed by Oncotype testing, radiation and anti-estrogen therapy. She may benefit from post op PT to regain shoulder ROM and reduce lymphedema risk. Due to her lack of comorbidities, her eval is of low complexity.   Rehab Potential Excellent   Clinical Impairments Affecting Rehab Potential none   PT Frequency One time visit   PT Treatment/Interventions Patient/family  education;Therapeutic exercise   PT Next Visit Plan Will f/u after surgery to determine PT needs   PT Home Exercise Plan Post op shoulder ROM HEP   Consulted and Agree with Plan of Care Patient;Family member/caregiver   Family Member Consulted Husband, brother      Patient will benefit from skilled therapeutic intervention in order to improve the following deficits and impairments:  Postural dysfunction, Decreased knowledge of precautions, Pain, Impaired UE functional use, Decreased range of motion  Visit Diagnosis: Carcinoma of upper-outer quadrant of left breast in female, estrogen receptor positive ([place]) - Plan: PT plan of care cert/re-cert  Abnormal posture - Plan: PT plan of care cert/re-cert   Patient will follow up at outpatient cancer rehab if needed following surgery.  If the patient requires physical therapy at that time, a specific plan will be dictated and sent to the referring physician for approval. The patient was educated today on appropriate basic range of motion exercises to begin post operatively and the importance of attending the After Breast Cancer class following surgery.  Patient was educated today on lymphedema risk reduction practices as it pertains to recommendations that will benefit the patient immediately following surgery.  She verbalized good understanding.  No additional physical therapy is indicated at this time.     Problem List Patient Active Problem List   Diagnosis Date Noted  . Malignant neoplasm of upper-outer quadrant of left breast in female, estrogen receptor positive ([place]) 02/03/2017    Annia Friendly, PT 02/04/17 11:16 AM  [place], [place], [address] Phone: [phone]   Fax:  [phone]  Name: SAVAYA HAKES MRN: [id number] Date of Birth: 05/17/1961

## 2017-02-04 NOTE — Progress Notes (Signed)
Nutrition Assessment  Reason for Assessment:  Pt seen in Breast Clinic  ASSESSMENT:   56 year old female with new diagnosis of breast cancer. Past medical history of HTN, acid reflux  Patient reports normal appetite  Medications:  Senna, omeprazole  Labs: reviewed  Anthropometrics:   Height: 66 inches Weight: 180 pounds BMI: 30.1   NUTRITION DIAGNOSIS: Food and nutrition related knowledge deficit related to new diagnosis of breast cancer as evidenced by no prior need for nutrition related information.  INTERVENTION:   Discussed and provided packet of information regarding nutritional tips for breast cancer patients.  Questions answered.  Teachback method used.      MONITORING, EVALUATION, and GOAL: Pt will consume a healthy plant based diet to maintain lean body mass throughout treatment.   Mykeal Carrick B. Zenia Resides, [place], [place] (pager)

## 2017-02-04 NOTE — Progress Notes (Signed)
Clinical Social Work [place] Psychosocial Distress Screening [place]  Patient completed distress screening protocol and scored a 0 on the Psychosocial Distress Thermometer which indicates mild distress. Clinical Social Worker met with patient and patients family in [facility] to assess for distress and other psychosocial needs. Patient stated she felt confident after meeting with the treatment team and getting more information on her treatment plan. CSW and patient discussed common feeling and emotions when being diagnosed with cancer, and the importance of support during treatment. CSW informed patient of the support team and support services at [facility]. CSW provided contact information and encouraged patient to call with any questions or concerns.   ONCBCN DISTRESS SCREENING 02/04/2017  Screening Type Initial Screening  Distress experienced in past week (1-10) 0    Johnnye Lana, MSW, LCSW, OSW-C Clinical Social Worker [facility] [phone]

## 2017-02-04 NOTE — Progress Notes (Signed)
[facility]  Telephone:([phone] Fax:([phone]) [phone]     ID: MAHLET JERGENS DOB: Sep 24, 1961  MR#: [id number]  TZG#:[id number]  Patient Care Team: Harlan Stains, MD as PCP - General (Family Medicine) Stark Klein, MD as Consulting Physician (General Surgery) Chauncey Cruel, MD as Consulting Physician (Oncology) [name], MD as Consulting Physician (Radiation Oncology) [name], MD as Consulting Physician (Orthopedic Surgery) Chauncey Cruel, MD OTHER MD:  CHIEF COMPLAINT: Estrogen receptor positive breast cancer  CURRENT TREATMENT: Awaiting definitive surgery   BREAST CANCER HISTORY: Kamika had routine bilateral screening mammography with tomography at the [facility] 01/16/2017. This showed a possible mass in the left breast. On 01/23/2017 she underwent left diagnostic mammography with tomography and ultrasonography. The breast density was category C. In the lateral posterior breast, there was an irregular 1 cm mass which by ultrasound was located at 8 cm from the nipple at the 1:30 o'clock position. This was irregular, hypoechoic, and measured 1.0 cm. Ultrasound of the left axilla was sonographically benign.  On favorite 12th 2018 Sharesa underwent biopsy of the left breast mass in question, and this showed ([id number]) invasive lobular carcinoma, E-cadherin negative, grade 1, estrogen receptor 100% positive, progesterone receptor 100% positive, both with strong staining intensity, with an MIB-1 of 70%, and HER-2 nonamplified, with a signals ratio of 1.24 and the number per cell 2.10.  Her subsequent history is as detailed below  INTERVAL HISTORY: Jolisa was evaluated in the multidisciplinary breast cancer clinic 02/04/2017 accompanied by her husband Gershon Mussel and her brother Chrissie Noa Her case was also presented in the multidisciplinary breast cancer conference that same morning. At that time a preliminary plan was proposed: MRI because this is a lobular tumor, but  most likely breast conserving surgery with sentinel lymph node sampling, and Oncotype DX. Radiation and hormones to follow as appropriate  REVIEW OF SYSTEMS: There were no specific symptoms leading to the original mammogram, which was routinely scheduled. The patient denies unusual headaches, visual changes, nausea, vomiting, stiff neck, dizziness, or gait imbalance. There has been no cough, phlegm production, or pleurisy, no chest pain or pressure, and no change in bowel or bladder habits. The patient denies fever, rash, bleeding, unexplained fatigue or unexplained weight loss. A detailed review of systems was otherwise entirely negative.   PAST MEDICAL HISTORY: Past Medical History:  Diagnosis Date  . Acid reflux   . Hypertension     PAST SURGICAL HISTORY: Past Surgical History:  Procedure Laterality Date  . BUNIONECTOMY Bilateral     FAMILY HISTORY Family History  Problem Relation Age of Onset  . Multiple myeloma Mother   . Lymphoma Father   The patient's father died from lymphoma at the age of 81. The patient's mother died from myeloma at the age of 52. The patient had 5 brothers, 3 sisters. There is no history of breast or ovarian cancer in the family.  GYNECOLOGIC HISTORY:  No LMP recorded. Patient is postmenopausal. Menarche age 89, first live birth age 73, the patient is [place] P3. She stopped having periods approximately 2013. She did not take hormone replacement. She used oral contraceptives for approximately 2 years remotely, with no complications.  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 Her husband [name] is a Glass blower/designer. Their daughter [name] works in [place] as a Geophysicist/field seismologist. Son [name] works in Biomedical scientist also in [place]. Daughter Genesis Stanton Kidney is a Electronics engineer hoping to become an Chief Technology Officer. The patient  has no grandchildren. She attends the [employer]   ADVANCED  DIRECTIVES: Not in place   HEALTH MAINTENANCE: Social History  Substance Use Topics  . Smoking status: Never Smoker  . Smokeless tobacco: Not on file  . Alcohol use No     Colonoscopy: 2014  PAP: 2014  Bone density: Never   Allergies  Allergen Reactions  . Codeine Nausea And Vomiting    Current Outpatient Prescriptions  Medication Sig Dispense Refill  . olmesartan-hydrochlorothiazide (BENICAR HCT) 40-12.5 MG per tablet Take 1 tablet by mouth daily.    Marland Kitchen omeprazole (PRILOSEC) 40 MG capsule Take 40 mg by mouth daily.     Marland Kitchen senna (SENOKOT) 8.6 MG tablet Take 1 tablet by mouth as needed for constipation.     No current facility-administered medications for this visit.     OBJECTIVE:Middle-aged African-American woman who appears well Vitals:   02/04/17 0856  BP: 135/81  Pulse: 87  Resp: 20  Temp: 98 F (36.7 C)     Body mass index is 30.02 kg/m.    ECOG FS:0 - Asymptomatic  Ocular: Sclerae unicteric, pupils equal, round and reactive to light Ear-nose-throat: Oropharynx clear and moist Lymphatic: No cervical or supraclavicular adenopathy Lungs no rales or rhonchi, good excursion bilaterally Heart regular rate and rhythm, no murmur appreciated Abd soft, nontender, positive bowel sounds MSK no focal spinal tenderness, no joint edema Neuro: non-focal, well-oriented, appropriate affect Breasts: The right breast is unremarkable. I do not palpate a mass in the left breast. There is no skin or nipple changes of concern. The left axilla is benign.   LAB RESULTS:  CMP     Component Value Date/Time   NA 140 02/04/2017 0843   K 3.6 02/04/2017 0843   CL 103 12/09/2015 2232   CO2 29 02/04/2017 0843   GLUCOSE 113 02/04/2017 0843   BUN 14.1 02/04/2017 0843   CREATININE 1.0 02/04/2017 0843   CALCIUM 10.3 02/04/2017 0843   PROT 7.3 02/04/2017 0843   ALBUMIN 3.9 02/04/2017 0843   AST 16 02/04/2017 0843   ALT 16 02/04/2017 0843   ALKPHOS 91 02/04/2017 0843   BILITOT 0.42  02/04/2017 0843   GFRNONAA 57 (L) 12/09/2015 2232   GFRAA >60 12/09/2015 2232    INo results found for: SPEP, UPEP  Lab Results  Component Value Date   WBC 4.2 02/04/2017   NEUTROABS 1.7 02/04/2017   HGB 13.5 02/04/2017   HCT 41.0 02/04/2017   MCV 84.2 02/04/2017   PLT 244 02/04/2017      Chemistry      Component Value Date/Time   NA 140 02/04/2017 0843   K 3.6 02/04/2017 0843   CL 103 12/09/2015 2232   CO2 29 02/04/2017 0843   BUN 14.1 02/04/2017 0843   CREATININE 1.0 02/04/2017 0843      Component Value Date/Time   CALCIUM 10.3 02/04/2017 0843   ALKPHOS 91 02/04/2017 0843   AST 16 02/04/2017 0843   ALT 16 02/04/2017 0843   BILITOT 0.42 02/04/2017 0843       No results found for: LABCA2  No components found for: LABCA125  No results for input(s): INR in the last 168 hours.  Urinalysis    Component Value Date/Time   COLORURINE YELLOW 12/09/2015 2237   APPEARANCEUR CLEAR 12/09/2015 2237   LABSPEC 1.024 12/09/2015 2237   PHURINE 5.5 12/09/2015 2237   GLUCOSEU NEGATIVE 12/09/2015 2237   HGBUR NEGATIVE 12/09/2015 2237   [place] NEGATIVE 12/09/2015 2237  KETONESUR NEGATIVE 12/09/2015 2237   PROTEINUR NEGATIVE 12/09/2015 2237   NITRITE NEGATIVE 12/09/2015 2237   LEUKOCYTESUR NEGATIVE 12/09/2015 2237     STUDIES: US Breast Ltd Uni Left Inc Axilla  Result Date: 01/23/2017 CLINICAL DATA:  Screening recall for a possible left breast mass. EXAM: 2D DIGITAL DIAGNOSTIC UNILATERAL LEFT MAMMOGRAM WITH CAD AND ADJUNCT TOMO LEFT BREAST ULTRASOUND COMPARISON:  Previous exam(s). ACR Breast Density Category c: The breast tissue is heterogeneously dense, which may obscure small masses. FINDINGS: In the lateral aspect of the left breast, posterior depth there is an irregular 1.0 cm mass with angular an indistinct margins. Mammographic images were processed with CAD. Ultrasound targeted to the left breast at 130, 8 cm from the nipple demonstrates an irregular hypoechoic  mass with a thin echogenic rind measuring 1.0 x 0.8 x 0.6 cm. No blood flow seen within the mass on color Doppler imaging. Ultrasound of the left axilla demonstrates multiple normal-appearing lymph nodes. IMPRESSION: 1. There is a highly suspicious 1.0 cm mass in the left breast at 1:30. 2.  No evidence of left axillary lymphadenopathy. RECOMMENDATION: Ultrasound-guided biopsy is recommended for the left breast mass at 1:30. This has been scheduled for 01/26/2017 at 12:45 p.m. I have discussed the findings and recommendations with the patient. Results were also provided in writing at the conclusion of the visit. If applicable, a reminder letter will be sent to the patient regarding the next appointment. BI-RADS CATEGORY  5: Highly suggestive of malignancy. Electronically Signed   By: Ammie Ferrier M.D.   On: 01/23/2017 12:43   Mm Diag Breast Tomo Uni Left  Result Date: 01/23/2017 CLINICAL DATA:  Screening recall for a possible left breast mass. EXAM: 2D DIGITAL DIAGNOSTIC UNILATERAL LEFT MAMMOGRAM WITH CAD AND ADJUNCT TOMO LEFT BREAST ULTRASOUND COMPARISON:  Previous exam(s). ACR Breast Density Category c: The breast tissue is heterogeneously dense, which may obscure small masses. FINDINGS: In the lateral aspect of the left breast, posterior depth there is an irregular 1.0 cm mass with angular an indistinct margins. Mammographic images were processed with CAD. Ultrasound targeted to the left breast at 130, 8 cm from the nipple demonstrates an irregular hypoechoic mass with a thin echogenic rind measuring 1.0 x 0.8 x 0.6 cm. No blood flow seen within the mass on color Doppler imaging. Ultrasound of the left axilla demonstrates multiple normal-appearing lymph nodes. IMPRESSION: 1. There is a highly suspicious 1.0 cm mass in the left breast at 1:30. 2.  No evidence of left axillary lymphadenopathy. RECOMMENDATION: Ultrasound-guided biopsy is recommended for the left breast mass at 1:30. This has been scheduled  for 01/26/2017 at 12:45 p.m. I have discussed the findings and recommendations with the patient. Results were also provided in writing at the conclusion of the visit. If applicable, a reminder letter will be sent to the patient regarding the next appointment. BI-RADS CATEGORY  5: Highly suggestive of malignancy. Electronically Signed   By: Ammie Ferrier M.D.   On: 01/23/2017 12:43   Mm Screening Breast Tomo Bilateral  Result Date: 01/19/2017 CLINICAL DATA:  Screening. EXAM: 2D DIGITAL SCREENING BILATERAL MAMMOGRAM WITH CAD AND ADJUNCT TOMO COMPARISON:  Previous exam(s). ACR Breast Density Category c: The breast tissue is heterogeneously dense, which may obscure small masses. FINDINGS: In the left breast, a possible mass warrants further evaluation. In the right breast, no findings suspicious for malignancy. Images were processed with CAD. IMPRESSION: Further evaluation is suggested for possible mass in the left breast. RECOMMENDATION: Diagnostic mammogram and possibly  ultrasound of the left breast. (Code:[id number]) The patient will be contacted regarding the findings, and additional imaging will be scheduled. BI-RADS CATEGORY  0: Incomplete. Need additional imaging evaluation and/or prior mammograms for comparison. Electronically Signed   By: Fidela Salisbury M.D.   On: 01/19/2017 07:52   Mm Clip Placement Left  Result Date: 01/26/2017 CLINICAL DATA:  Ultrasound-guided biopsy was performed of a suspicious 1.0 cm mass in the 1:30 position of the left breast. EXAM: DIAGNOSTIC LEFT MAMMOGRAM POST ULTRASOUND BIOPSY COMPARISON:  Previous exam(s). FINDINGS: Mammographic images were obtained following ultrasound guided biopsy of a left breast mass at 1:30 position approximately 8 cm from the nipple. A ribbon shaped biopsy clip is satisfactorily positioned along the anterior margin of the mass. IMPRESSION: Satisfactory position of ribbon shaped biopsy clip. Final Assessment: Post Procedure Mammograms for Marker  Placement Electronically Signed   By: Curlene Dolphin M.D.   On: 01/26/2017 13:43   [place] Lt Breast Bx W Loc Dev 1st Lesion Img Bx Spec US Guide  Addendum Date: 01/27/2017   ADDENDUM REPORT: 01/27/2017 14:23 ADDENDUM: Pathology revealed GRADE I INVASIVE MAMMARY CARCINOMA of the Left breast, upper outer quadrant 1:30 o'clock. The malignant cells are negative for E-Cadherin, suggesting a lobular phenotype. This was found to be concordant by Dr. Curlene Dolphin. Pathology results were discussed with the patient by telephone. The patient reported doing well after the biopsy with tenderness at the site. Post biopsy instructions and care were reviewed and questions were answered. The patient was encouraged to call [facility] for any additional concerns. The patient was referred to [facility] on February 04, 2017. Recommendation for MRI due to lobular diagnosis. Pathology results reported by Terie Purser, RN on 01/27/2017. Electronically Signed   By: Curlene Dolphin M.D.   On: 01/27/2017 14:23   Result Date: 01/27/2017 CLINICAL DATA:  Suspicious 1.0 cm mass 1:30 position left breast identified on a recent screening mammogram. Ultrasound-guided biopsy was recommended. EXAM: ULTRASOUND GUIDED LEFT BREAST CORE NEEDLE BIOPSY COMPARISON:  Previous exam(s). FINDINGS: I met with the patient and we discussed the procedure of ultrasound-guided biopsy, including benefits and alternatives. We discussed the high likelihood of a successful procedure. We discussed the risks of the procedure, including infection, bleeding, tissue injury, clip migration, and inadequate sampling. Informed written consent was given. The usual time-out protocol was performed immediately prior to the procedure. Using sterile technique and 1% Lidocaine as local anesthetic, under direct ultrasound visualization, a 14 gauge spring-loaded device was used to  perform biopsy of a 1.0 cm suspicious mass at 1:30 position 8 cm from the nipple using a lateral to medial approach. At the conclusion of the procedure a ribbon shaped tissue marker clip was deployed into the biopsy cavity. Follow up 2 view mammogram was performed and dictated separately. IMPRESSION: Ultrasound guided biopsy of the left breast. No apparent complications. Electronically Signed: By: Curlene Dolphin M.D. On: 01/26/2017 13:44    ELIGIBLE FOR AVAILABLE RESEARCH PROTOCOL: no  ASSESSMENT: 56 y.o. [place] woman status post left breast upper outer quadrant biopsy every 12/03/2017, for a clinical T1BN0, stage IA invasive lobular carcinoma, grade 1, E-cadherin negative, estrogen and progesterone receptor positive, HER-2 not amplified, with an MIB-1 of 70%.   (1) breast conserving surgery planned after MRI  (2) Oncotype DX to be sent from definitive surgical specimen  (3) adjuvant radiation to follow as appropriate  (4) anti-estrogens to follow the completion  of local treatment.    PLAN: We spent the better part of today's hour-long appointment discussing the biology of breast cancer in general, and the specifics of the patient's tumor in particular. We first reviewed the fact that cancer is not one disease but more than 100 different diseases and that it is important to keep them separate-- otherwise when friends and relatives discuss their own cancer experiences with [place] confusion can result. Similarly we explained that if breast cancer spreads to the bone or liver, the patient would not have bone cancer or liver cancer, but breast cancer in the bone and breast cancer in the liver: one cancer in three places-- not 3 different cancers which otherwise would have to be treated in 3 different ways.  We discussed the difference between local and systemic therapy. In terms of loco-regional treatment, lumpectomy plus radiation is equivalent to mastectomy as far as survival is concerned. For  this reason, and because the cosmetic results are generally superior, we recommend breast conserving surgery. We also discussed the fact that lobular tumors are less cohesive and harder to detect. For that reason we are going to obtain a breast MRI preop. Even with that it is not unusual to have positive margins on initial surgery requiring further resection.  We then discussed the rationale for systemic therapy. There is some risk that this cancer may have already spread to other parts of her body. Patients frequently ask at this point about bone scans, CAT scans and PET scans to find out if they have occult breast cancer somewhere else. The problem is that in early stage disease we are much more likely to find false positives then true cancers and this would expose the patient to unnecessary procedures as well as unnecessary radiation. Scans cannot answer the question the patient really would like to know, which is whether she has microscopic disease elsewhere in her body. For those reasons we do not recommend them.  Of course we would proceed to aggressive evaluation of any symptoms that might suggest metastatic disease, but that is not the case here.  Next we went over the options for systemic therapy which are anti-estrogens, anti-HER-2 immunotherapy, and chemotherapy. Annarose does not meet criteria for anti-HER-2 immunotherapy. She is a good candidate for anti-estrogens.  The question of chemotherapy is more complicated. Chemotherapy is most effective in rapidly growing, aggressive tumors. It is much less effective in low-grade, slow growing cancers. In Deepika's case we have a nonaggressive looking tumor which is nevertheless fast growing.. For that reason we are going to request an Oncotype from the definitive surgical sample, as suggested by NCCN guidelines. That will help [place] make a definitive decision regarding chemotherapy in this case.  The plan accordingly is to start with an MRI, proceed to  definitive surgery, obtain an Oncotype from the final tumor specimen, and then discuss whether chemotherapy is necessary or would be helpful, after which the patient can proceed to radiation and ultimately 5 years of anti-estrogens.  Maidie has a good understanding of the overall plan. She agrees with it. She knows the goal of treatment in her case is cure. She will call with any problems that may develop before her next visit here.  Chauncey Cruel, MD   02/04/2017 8:38 PM Medical Oncology and Hematology [facility] [address] [place], [place] [address] Tel. [phone]    Fax. [phone]

## 2017-02-04 NOTE — Progress Notes (Signed)
Radiation Oncology         [phone]) [phone] ________________________________  Initial Outpatient Consultation  Name: Shelly Beasley MRN: [id number]  Date: 02/04/2017  DOB: [birth year]  DJ:SHFWY,OVZCHYI S, MD  Stark Klein, MD   REFERRING PHYSICIAN: Stark Klein, MD  DIAGNOSIS: Clinical stage IA (cT1bN0) invasive lobular carcinoma of the left breast (ER+,PR+,HER2-)  HISTORY OF PRESENT ILLNESS::Shelly Beasley is a 56 y.o. female who had a screening mammogram on 01/16/17 showing a possible mass in the left breast. Diagnostic left breast mammogram and ultrasound on 01/23/17 showed a 1.0 x 0.8 x 0.6 cm irregular hypoechoic mass in the 1:30 position 8 cm from the nipple. Ultrasound of the left axilla showed multiple normal-appearing lymph nodes. Biopsy of this mass on 01/26/17 revealed grade 1 invasive lobular carcinoma (ER 100% positive, PR 100% positive, HER2 negative, Ki67 70%). The patient presents today in multidisciplinary breast clinic to discuss treatment options for the management of her disease.  Gynecologic History  Age at first menstrual period? 14  Are you still having periods? No Approximate date of last period? 4-5 years ago.  If you no longer have periods: Have you used hormone replacement? No Obstetric History:  How many children have you carried to term? 3 Your age at first live birth? 22  Pregnant now or trying to get pregnant? No  Have you used birth control pills or hormone shots for contraception? Yes (birth control pills)  If so, for how long (or approximate dates)? 1997/1998  Would you be interested in learning more about the options to preserve fertility? No Health Maintenance:  Have you ever had a colonoscopy? Yes If yes, date? 02/2013  Have you ever had a bone density? No  Date of your last PAP smear? 2017 Date of your FIRST mammogram? 2013/2014   PREVIOUS RADIATION THERAPY: No  PAST MEDICAL HISTORY:  has a past medical history of Acid reflux and Hypertension.    PAST  SURGICAL HISTORY:No past surgical history on file.  FAMILY HISTORY: family history is not on file.  SOCIAL HISTORY:  reports that she has never smoked. She does not have any smokeless tobacco history on file. She reports that she does not drink alcohol or use drugs.  ALLERGIES: Codeine  MEDICATIONS:  Current Outpatient Prescriptions  Medication Sig Dispense Refill  . olmesartan-hydrochlorothiazide (BENICAR HCT) 40-12.5 MG per tablet Take 1 tablet by mouth daily.    Marland Kitchen omeprazole (PRILOSEC) 40 MG capsule Take 40 mg by mouth daily.      No current facility-administered medications for this encounter.     REVIEW OF SYSTEMS:  A 15 point review of systems is documented in the electronic medical record. This was obtained by the nursing staff. However, I reviewed this with the patient to discuss relevant findings and make appropriate changes.  Pertinent items noted in HPI and remainder of comprehensive ROS otherwise negative.   The patient wears glasses, contacts, and dentures. Her only complaint are hot flashes.   PHYSICAL EXAM:  Vitals with BMI 02/04/2017  Height 5' 5"   Weight 180 lbs 6 oz  BMI 50.2  Systolic 774  Diastolic 81  Pulse 87  Respirations 20   General: Alert and oriented, in no acute distress HEENT: Head is normocephalic. Extraocular movements are intact. Oropharynx is clear. Neck: Neck is supple, no palpable cervical or supraclavicular lymphadenopathy. Heart: Regular in rate and rhythm with no murmurs, rubs, or gallops. Chest: Clear to auscultation bilaterally, with no rhonchi, wheezes, or rales. Abdomen: Soft, nontender, nondistended,  with no rigidity or guarding. Extremities: No cyanosis or edema. Lymphatics: see Neck Exam Skin: No concerning lesions. Musculoskeletal: symmetric strength and muscle tone throughout. Neurologic: Cranial nerves II through XII are grossly intact. No obvious focalities. Speech is fluent. Coordination is intact. Psychiatric: Judgment and  insight are intact. Affect is appropriate. Breast: Right breast no palpable mass or nipple discharge. Left breast no palpable mass or nipple discharge, small biopsy site in the far upper outer quadrant.  ECOG = 0   LABORATORY DATA:  Lab Results  Component Value Date   WBC 5.3 12/09/2015   HGB 13.4 12/09/2015   HCT 41.0 12/09/2015   MCV 86.3 12/09/2015   PLT 256 12/09/2015   NEUTROABS 2.7 06/29/2007   Lab Results  Component Value Date   NA 138 12/09/2015   K 3.9 12/09/2015   CL 103 12/09/2015   CO2 27 12/09/2015   GLUCOSE 140 (H) 12/09/2015   CREATININE 1.09 (H) 12/09/2015   CALCIUM 9.6 12/09/2015      RADIOGRAPHY: US Breast Ltd Uni Left Inc Axilla  Result Date: 01/23/2017 CLINICAL DATA:  Screening recall for a possible left breast mass. EXAM: 2D DIGITAL DIAGNOSTIC UNILATERAL LEFT MAMMOGRAM WITH CAD AND ADJUNCT TOMO LEFT BREAST ULTRASOUND COMPARISON:  Previous exam(s). ACR Breast Density Category c: The breast tissue is heterogeneously dense, which may obscure small masses. FINDINGS: In the lateral aspect of the left breast, posterior depth there is an irregular 1.0 cm mass with angular an indistinct margins. Mammographic images were processed with CAD. Ultrasound targeted to the left breast at 130, 8 cm from the nipple demonstrates an irregular hypoechoic mass with a thin echogenic rind measuring 1.0 x 0.8 x 0.6 cm. No blood flow seen within the mass on color Doppler imaging. Ultrasound of the left axilla demonstrates multiple normal-appearing lymph nodes. IMPRESSION: 1. There is a highly suspicious 1.0 cm mass in the left breast at 1:30. 2.  No evidence of left axillary lymphadenopathy. RECOMMENDATION: Ultrasound-guided biopsy is recommended for the left breast mass at 1:30. This has been scheduled for 01/26/2017 at 12:45 p.m. I have discussed the findings and recommendations with the patient. Results were also provided in writing at the conclusion of the visit. If applicable, a  reminder letter will be sent to the patient regarding the next appointment. BI-RADS CATEGORY  5: Highly suggestive of malignancy. Electronically Signed   By: Ammie Ferrier M.D.   On: 01/23/2017 12:43   Mm Diag Breast Tomo Uni Left  Result Date: 01/23/2017 CLINICAL DATA:  Screening recall for a possible left breast mass. EXAM: 2D DIGITAL DIAGNOSTIC UNILATERAL LEFT MAMMOGRAM WITH CAD AND ADJUNCT TOMO LEFT BREAST ULTRASOUND COMPARISON:  Previous exam(s). ACR Breast Density Category c: The breast tissue is heterogeneously dense, which may obscure small masses. FINDINGS: In the lateral aspect of the left breast, posterior depth there is an irregular 1.0 cm mass with angular an indistinct margins. Mammographic images were processed with CAD. Ultrasound targeted to the left breast at 130, 8 cm from the nipple demonstrates an irregular hypoechoic mass with a thin echogenic rind measuring 1.0 x 0.8 x 0.6 cm. No blood flow seen within the mass on color Doppler imaging. Ultrasound of the left axilla demonstrates multiple normal-appearing lymph nodes. IMPRESSION: 1. There is a highly suspicious 1.0 cm mass in the left breast at 1:30. 2.  No evidence of left axillary lymphadenopathy. RECOMMENDATION: Ultrasound-guided biopsy is recommended for the left breast mass at 1:30. This has been scheduled for 01/26/2017 at 12:45 p.m.  I have discussed the findings and recommendations with the patient. Results were also provided in writing at the conclusion of the visit. If applicable, a reminder letter will be sent to the patient regarding the next appointment. BI-RADS CATEGORY  5: Highly suggestive of malignancy. Electronically Signed   By: Ammie Ferrier M.D.   On: 01/23/2017 12:43   Mm Screening Breast Tomo Bilateral  Result Date: 01/19/2017 CLINICAL DATA:  Screening. EXAM: 2D DIGITAL SCREENING BILATERAL MAMMOGRAM WITH CAD AND ADJUNCT TOMO COMPARISON:  Previous exam(s). ACR Breast Density Category c: The breast tissue is  heterogeneously dense, which may obscure small masses. FINDINGS: In the left breast, a possible mass warrants further evaluation. In the right breast, no findings suspicious for malignancy. Images were processed with CAD. IMPRESSION: Further evaluation is suggested for possible mass in the left breast. RECOMMENDATION: Diagnostic mammogram and possibly ultrasound of the left breast. (Code:[id number]) The patient will be contacted regarding the findings, and additional imaging will be scheduled. BI-RADS CATEGORY  0: Incomplete. Need additional imaging evaluation and/or prior mammograms for comparison. Electronically Signed   By: Fidela Salisbury M.D.   On: 01/19/2017 07:52   Mm Clip Placement Left  Result Date: 01/26/2017 CLINICAL DATA:  Ultrasound-guided biopsy was performed of a suspicious 1.0 cm mass in the 1:30 position of the left breast. EXAM: DIAGNOSTIC LEFT MAMMOGRAM POST ULTRASOUND BIOPSY COMPARISON:  Previous exam(s). FINDINGS: Mammographic images were obtained following ultrasound guided biopsy of a left breast mass at 1:30 position approximately 8 cm from the nipple. A ribbon shaped biopsy clip is satisfactorily positioned along the anterior margin of the mass. IMPRESSION: Satisfactory position of ribbon shaped biopsy clip. Final Assessment: Post Procedure Mammograms for Marker Placement Electronically Signed   By: Curlene Dolphin M.D.   On: 01/26/2017 13:43   [place] Lt Breast Bx W Loc Dev 1st Lesion Img Bx Spec US Guide  Addendum Date: 01/27/2017   ADDENDUM REPORT: 01/27/2017 14:23 ADDENDUM: Pathology revealed GRADE I INVASIVE MAMMARY CARCINOMA of the Left breast, upper outer quadrant 1:30 o'clock. The malignant cells are negative for E-Cadherin, suggesting a lobular phenotype. This was found to be concordant by Dr. Curlene Dolphin. Pathology results were discussed with the patient by telephone. The patient reported doing well after the biopsy with tenderness at the site. Post biopsy instructions and care  were reviewed and questions were answered. The patient was encouraged to call [facility] for any additional concerns. The patient was referred to [facility] on February 04, 2017. Recommendation for MRI due to lobular diagnosis. Pathology results reported by Terie Purser, RN on 01/27/2017. Electronically Signed   By: Curlene Dolphin M.D.   On: 01/27/2017 14:23   Result Date: 01/27/2017 CLINICAL DATA:  Suspicious 1.0 cm mass 1:30 position left breast identified on a recent screening mammogram. Ultrasound-guided biopsy was recommended. EXAM: ULTRASOUND GUIDED LEFT BREAST CORE NEEDLE BIOPSY COMPARISON:  Previous exam(s). FINDINGS: I met with the patient and we discussed the procedure of ultrasound-guided biopsy, including benefits and alternatives. We discussed the high likelihood of a successful procedure. We discussed the risks of the procedure, including infection, bleeding, tissue injury, clip migration, and inadequate sampling. Informed written consent was given. The usual time-out protocol was performed immediately prior to the procedure. Using sterile technique and 1% Lidocaine as local anesthetic, under direct ultrasound visualization, a 14 gauge spring-loaded device was used to perform biopsy of a 1.0 cm suspicious mass at 1:30  position 8 cm from the nipple using a lateral to medial approach. At the conclusion of the procedure a ribbon shaped tissue marker clip was deployed into the biopsy cavity. Follow up 2 view mammogram was performed and dictated separately. IMPRESSION: Ultrasound guided biopsy of the left breast. No apparent complications. Electronically Signed: By: Curlene Dolphin M.D. On: 01/26/2017 13:44      IMPRESSION: Clinical stage IA (cT1bN0) invasive lobular carcinoma of the left breast (ER+,PR+,HER2-)  The patient would be an excellent candidate for breast conservation therapy with  radiation directed to the left breast.  I spoke to the patient today regarding her diagnosis and options for treatment. We discussed the equivalence in terms of survival and local failure between mastectomy and breast conservation. We discussed the role of radiation in decreasing local failures in patients who undergo lumpectomy. We discussed the process of CT simulation and the placement tattoos. We discussed 4-6 weeks of treatment as an outpatient. We discussed the possibility of asymptomatic lung damage. We discussed the low likelihood of secondary malignancies. We discussed the possible side effects including but not limited to skin redness, fatigue, permanent skin darkening, and breast swelling. We discussed the use of cardiac sparing with deep inspiration breath hold if needed.  PLAN: The patient will proceed will proceed with a bilateral breast MRI on 02/07/17 given the diagnosis of lobular carcinoma. She will then have surgery consisting of a left lumpectomy and sentinel lymph node biopsy. Oncotype DX testing will be performed on the pathology to determine the need of chemotherapy. If chemotherapy is not required, then the patient will then present to me 2-3 weeks after surgery to further discuss radiation therapy. After radiation therapy is completed, she will be placed on an aromatase inhibitor to further reduce her risk of recurrence.     ------------------------------------------------  Blair Promise, PhD, MD  This document serves as a record of services personally performed by [name], MD. It was created on his behalf by Darcus Austin, a trained medical scribe. The creation of this record is based on the scribe's personal observations and the provider's statements to them. This document has been checked and approved by the attending provider.

## 2017-02-07 ENCOUNTER — Ambulatory Visit (HOSPITAL_COMMUNITY)
Admission: RE | Admit: 2017-02-07 | Discharge: 2017-02-07 | Disposition: A | Discharge status: Home / Self Care | Payer: BC Managed Care – PPO | Source: Ambulatory Visit | Attending: General Surgery | Admitting: General Surgery

## 2017-02-07 DIAGNOSIS — C50412 Malignant neoplasm of upper-outer quadrant of left female breast: Secondary | ICD-10-CM | POA: Diagnosis present

## 2017-02-07 DIAGNOSIS — Z170 Estrogen receptor positive status [ER+]: Secondary | ICD-10-CM | POA: Diagnosis not present

## 2017-02-07 MED ORDER — GADOBENATE DIMEGLUMINE 529 MG/ML IV SOLN
17.0000 mL | Freq: Once | INTRAVENOUS | Status: AC | PRN
  Administered 2017-02-07: 17 mL via INTRAVENOUS

## 2017-02-09 ENCOUNTER — Other Ambulatory Visit: Payer: Self-pay | Admitting: General Surgery

## 2017-02-09 ENCOUNTER — Telehealth: Payer: Self-pay | Admitting: *Deleted

## 2017-02-09 DIAGNOSIS — C50412 Malignant neoplasm of upper-outer quadrant of left female breast: Secondary | ICD-10-CM

## 2017-02-09 DIAGNOSIS — Z170 Estrogen receptor positive status [ER+]: Principal | ICD-10-CM

## 2017-02-09 NOTE — Progress Notes (Signed)
Please let patient know that there are no surprises on MRI.  Will write orders for surgery with lumpectomy, sentinel node.

## 2017-02-09 NOTE — Telephone Encounter (Signed)
  Oncology Nurse Navigator Documentation  Navigator Location: [facility] (02/09/17 1100)   )Navigator Encounter Type: Telephone (02/09/17 1100) Telephone: [facility] Call;Clinic/[facility] Follow-up (02/09/17 1100)                       Barriers/Navigation Needs: No barriers at this time (02/09/17 1100)                          Time Spent with Patient: 15 (02/09/17 1100)

## 2017-02-12 DIAGNOSIS — C801 Malignant (primary) neoplasm, unspecified: Secondary | ICD-10-CM

## 2017-02-12 HISTORY — DX: Malignant (primary) neoplasm, unspecified: C80.1

## 2017-02-20 ENCOUNTER — Other Ambulatory Visit: Payer: Self-pay | Admitting: General Surgery

## 2017-02-26 ENCOUNTER — Other Ambulatory Visit: Payer: Self-pay | Admitting: General Surgery

## 2017-02-26 DIAGNOSIS — Z170 Estrogen receptor positive status [ER+]: Principal | ICD-10-CM

## 2017-02-26 DIAGNOSIS — C50412 Malignant neoplasm of upper-outer quadrant of left female breast: Secondary | ICD-10-CM

## 2017-03-03 ENCOUNTER — Telehealth: Payer: Self-pay | Admitting: Oncology

## 2017-03-03 NOTE — Telephone Encounter (Signed)
Scheduled appt per schedule message from [facility]. Patient is aware of new appt date and time.

## 2017-03-13 ENCOUNTER — Ambulatory Visit: Payer: Self-pay | Admitting: Oncology

## 2017-03-25 ENCOUNTER — Encounter (HOSPITAL_BASED_OUTPATIENT_CLINIC_OR_DEPARTMENT_OTHER): Payer: Self-pay | Admitting: *Deleted

## 2017-03-26 ENCOUNTER — Encounter (HOSPITAL_BASED_OUTPATIENT_CLINIC_OR_DEPARTMENT_OTHER)
Admission: RE | Admit: 2017-03-26 | Discharge: 2017-03-26 | Disposition: A | Discharge status: Home / Self Care | Payer: BC Managed Care – PPO | Source: Ambulatory Visit | Attending: General Surgery | Admitting: General Surgery

## 2017-03-26 DIAGNOSIS — Z170 Estrogen receptor positive status [ER+]: Secondary | ICD-10-CM | POA: Diagnosis not present

## 2017-03-26 DIAGNOSIS — I10 Essential (primary) hypertension: Secondary | ICD-10-CM | POA: Diagnosis not present

## 2017-03-26 DIAGNOSIS — K219 Gastro-esophageal reflux disease without esophagitis: Secondary | ICD-10-CM | POA: Diagnosis not present

## 2017-03-26 DIAGNOSIS — C50412 Malignant neoplasm of upper-outer quadrant of left female breast: Secondary | ICD-10-CM | POA: Diagnosis not present

## 2017-03-26 LAB — BASIC METABOLIC PANEL
Anion gap: 8 (ref 5–15)
BUN: 15 mg/dL (ref 6–20)
CHLORIDE: 101 mmol/L (ref 101–111)
CO2: 30 mmol/L (ref 22–32)
Calcium: 9.4 mg/dL (ref 8.9–10.3)
Creatinine, Ser: 0.84 mg/dL (ref 0.44–1.00)
Glucose, Bld: 139 mg/dL — ABNORMAL HIGH (ref 65–99)
POTASSIUM: 4.1 mmol/L (ref 3.5–5.1)
Sodium: 139 mmol/L (ref 135–145)

## 2017-03-26 NOTE — Progress Notes (Addendum)
Boost drink given with instructions to complete by 0600, dos- pt verbalized understanding.Hibliclens soap given with instructions/ pt verbalized understanding.  Glucose results-139, notified Dr. Conrad [place], will check CBG day of surgery.

## 2017-03-31 ENCOUNTER — Ambulatory Visit
Admission: RE | Admit: 2017-03-31 | Discharge: 2017-03-31 | Disposition: A | Discharge status: Home / Self Care | Payer: BC Managed Care – PPO | Source: Ambulatory Visit | Attending: General Surgery | Admitting: General Surgery

## 2017-03-31 DIAGNOSIS — C50412 Malignant neoplasm of upper-outer quadrant of left female breast: Secondary | ICD-10-CM

## 2017-03-31 DIAGNOSIS — Z170 Estrogen receptor positive status [ER+]: Principal | ICD-10-CM

## 2017-03-31 NOTE — H&P (Signed)
Shelly Beasley  Location: [facility] Patient #: [id number] DOB: 16-Dec-1960 Undefined / Language: Cleophus Molt / Race: Black or African American Female   History of Present Illness  The patient is a 56 year old female who presents with breast cancer. Pt is a 56 yo F referred by Dr. Radford Pax for consultation regarding new diagnosis of left breast cancer. She had screening detected left breast mass. She was found to have a 1 cm mass in the upper outer quadrant on diagnostic imaging and a core needle biopsy was performed. This demonstrated grade 1 invasive mammary carcinoma, lobular phenotype, ER/PR positive, Her 2 negative. Breast density category C. She denies any palpable breast mass. She does not have any family history of breast cancer, but she does have a h/o lymphoma in her father and multiple myeloma in her mother. She has 3 children, the first at age 79. She had menarche at age 59.   mammogram/ultrasound FINDINGS: In the lateral aspect of the left breast, posterior depth there is an irregular 1.0 cm mass with angular an indistinct margins.  Mammographic images were processed with CAD.  Ultrasound targeted to the left breast at 130, 8 cm from the nipple demonstrates an irregular hypoechoic mass with a thin echogenic rind measuring 1.0 x 0.8 x 0.6 cm. No blood flow seen within the mass on color Doppler imaging. Ultrasound of the left axilla demonstrates multiple normal-appearing lymph nodes.  IMPRESSION: 1. There is a highly suspicious 1.0 cm mass in the left breast at 1:30.  2. No evidence of left axillary lymphadenopathy.  RECOMMENDATION: Ultrasound-guided biopsy is recommended for the left breast mass at 1:30. This has been scheduled for 01/26/2017 at 12:45 p.m.  CBC, cmet, essentially normal.   pathology Breast, left, needle core biopsy, upper outer quadrant 1:30 o'clock - INVASIVE MAMMARY CARCINOMA. The malignant cells are negative for E-Cadherin,  suggesting a lobular phenotype.   Past Surgical History Breast Biopsy  Left. Foot Surgery  Bilateral.  Diagnostic Studies History Colonoscopy  1-5 years ago Mammogram  within last year Pap Smear  1-5 years ago  Medication History Medications Reconciled  Social History Alcohol use  Occasional alcohol use. Caffeine use  Carbonated beverages, Coffee, Tea. No drug use  Tobacco use  Never smoker.  Family History Alcohol Abuse  Father. Cerebrovascular Accident  Brother. Diabetes Mellitus  Mother, Sister. Heart Disease  Mother. Hypertension  Mother, Sister. Seizure disorder  Father.  Pregnancy / Birth History Age at menarche  66 years. Age of menopause  20-50 Contraceptive History  Oral contraceptives. Gravida  3 Maternal age  103-30 Para  3  Other Problems Gastroesophageal Reflux Disease  Hemorrhoids  High blood pressure  Lump In Breast     Review of Systems  General Not Present- Appetite Loss, Chills, Fatigue, Fever, Night Sweats, Weight Gain and Weight Loss. Skin Not Present- Change in Wart/Mole, Dryness, Hives, Jaundice, New Lesions, Non-Healing Wounds, Rash and Ulcer. HEENT Present- Wears glasses/contact lenses. Not Present- Earache, Hearing Loss, Hoarseness, Nose Bleed, Oral Ulcers, Ringing in the Ears, Seasonal Allergies, Sinus Pain, Sore Throat, Visual Disturbances and Yellow Eyes. Respiratory Present- Snoring. Not Present- Bloody sputum, Chronic Cough, Difficulty Breathing and Wheezing. Breast Present- Breast Mass. Not Present- Breast Pain, Nipple Discharge and Skin Changes. Cardiovascular Not Present- Chest Pain, Difficulty Breathing Lying Down, Leg Cramps, Palpitations, Rapid Heart Rate, Shortness of Breath and Swelling of Extremities. Gastrointestinal Present- Hemorrhoids. Not Present- Abdominal Pain, Bloating, Bloody Stool, Change in Bowel Habits, Chronic diarrhea, Constipation, Difficulty Swallowing, Excessive  gas, Gets full quickly  at meals, Indigestion, Nausea, Rectal Pain and Vomiting. Female Genitourinary Not Present- Frequency, Nocturia, Painful Urination, Pelvic Pain and Urgency. Musculoskeletal Not Present- Back Pain, Joint Pain, Joint Stiffness, Muscle Pain, Muscle Weakness and Swelling of Extremities. Neurological Not Present- Decreased Memory, Fainting, Headaches, Numbness, Seizures, Tingling, Tremor, Trouble walking and Weakness. Psychiatric Not Present- Anxiety, Bipolar, Change in Sleep Pattern, Depression, Fearful and Frequent crying. Endocrine Present- Hot flashes. Not Present- Cold Intolerance, Excessive Hunger, Hair Changes, Heat Intolerance and New Diabetes.   Physical Exam  General Mental Status-Alert. General Appearance-Consistent with stated age. Hydration-Well hydrated. Voice-Normal.  Head and Neck Head-normocephalic, atraumatic with no lesions or palpable masses. Trachea-midline. Thyroid Gland Characteristics - normal size and consistency.  Eye Eyeball - Bilateral-Extraocular movements intact. Sclera/Conjunctiva - Bilateral-No scleral icterus.  Chest and Lung Exam Chest and lung exam reveals -quiet, even and easy respiratory effort with no use of accessory muscles and on auscultation, normal breath sounds, no adventitious sounds and normal vocal resonance. Inspection Chest Wall - Normal. Back - normal.  Breast Note: breasts reasonably symmetric. no palpable mass in either breast. no nipple retraction or skin dimpling. moderately ptotic. small hematoma at biopsy site. no LAD.   Cardiovascular Cardiovascular examination reveals -normal heart sounds, regular rate and rhythm with no murmurs and normal pedal pulses bilaterally.  Abdomen Inspection Inspection of the abdomen reveals - No Hernias. Palpation/Percussion Palpation and Percussion of the abdomen reveal - Soft, Non Tender, No Rebound tenderness, No Rigidity (guarding) and No  hepatosplenomegaly. Auscultation Auscultation of the abdomen reveals - Bowel sounds normal.  Neurologic Neurologic evaluation reveals -alert and oriented x 3 with no impairment of recent or remote memory. Mental Status-Normal.  Musculoskeletal Global Assessment -Note: no gross deformities.  Normal Exam - Left-Upper Extremity Strength Normal and Lower Extremity Strength Normal. Normal Exam - Right-Upper Extremity Strength Normal and Lower Extremity Strength Normal.  Lymphatic Head & Neck  General Head & Neck Lymphatics: Bilateral - Description - Normal. Axillary  General Axillary Region: Bilateral - Description - Normal. Tenderness - Non Tender. Femoral & Inguinal  Generalized Femoral & Inguinal Lymphatics: Bilateral - Description - No Generalized lymphadenopathy.    Assessment & Plan CARCINOMA OF UPPER-OUTER QUADRANT OF LEFT BREAST IN FEMALE, ESTROGEN RECEPTOR POSITIVE (C50.412) Impression: Pt has new diagnosis of stage IA left breast cancer. cT1cN0.  We will recommend MRI due to lobular phenotype. Barring unforeseen results, we will recommend lumpectomy and sentinel lymph node biopsy. This would be followed by oncotype Dx from surgical specimen as long as final pathology shows 1 cm tumor.  Additionally adjuvant XRT and antiestrogen therapy would also be recommended.  The surgical procedure was described to the patient. I discussed the incision type and location and that we would need radiology involved on with a wire or seed marker and/or sentinel node.  The risks and benefits of the procedure were described to the patient and she wishes to proceed.  We discussed the risks bleeding, infection, damage to other structures, need for further procedures/surgeries. We discussed the risk of seroma. The patient was advised if the area in the breast in cancer, we may need to go back to surgery for additional tissue to obtain negative margins or for a lymph node biopsy. The  patient was advised that these are the most common complications, but that others can occur as well. They were advised against taking aspirin or other anti-inflammatory agents/blood thinners the week before surgery.  60 min spent in evaluation, examination, counseling, and  coordination of care. >50% spent in counseling. Current Plans Pt Education - [facility] Education - Written Instructions given: discussed with patient and provided information. You are being scheduled for surgery- Our schedulers will call you.  You should hear from our office's scheduling department within 5 working days about the location, date, and time of surgery. We try to make accommodations for patient's preferences in scheduling surgery, but sometimes the OR schedule or the surgeon's schedule prevents [place] from making those accommodations.  If you have not heard from our office [phone]) in 5 working days, call the office and ask for your surgeon's nurse.  If you have other questions about your diagnosis, plan, or surgery, call the office and ask for your surgeon's nurse.    Signed by Stark Klein, MD

## 2017-04-02 ENCOUNTER — Ambulatory Visit (HOSPITAL_BASED_OUTPATIENT_CLINIC_OR_DEPARTMENT_OTHER): Payer: BC Managed Care – PPO | Admitting: Anesthesiology

## 2017-04-02 ENCOUNTER — Ambulatory Visit
Admission: RE | Admit: 2017-04-02 | Discharge: 2017-04-02 | Disposition: A | Discharge status: Home / Self Care | Payer: BC Managed Care – PPO | Source: Ambulatory Visit | Attending: General Surgery | Admitting: General Surgery

## 2017-04-02 ENCOUNTER — Encounter (HOSPITAL_COMMUNITY)
Admission: RE | Admit: 2017-04-02 | Discharge: 2017-04-02 | Disposition: A | Discharge status: Home / Self Care | Payer: BC Managed Care – PPO | Source: Ambulatory Visit | Attending: General Surgery | Admitting: General Surgery

## 2017-04-02 ENCOUNTER — Encounter (HOSPITAL_BASED_OUTPATIENT_CLINIC_OR_DEPARTMENT_OTHER): Payer: Self-pay

## 2017-04-02 ENCOUNTER — Encounter (HOSPITAL_BASED_OUTPATIENT_CLINIC_OR_DEPARTMENT_OTHER)
Admission: RE | Disposition: A | Discharge status: Home / Self Care | Payer: Self-pay | Source: Ambulatory Visit | Attending: General Surgery

## 2017-04-02 ENCOUNTER — Ambulatory Visit (HOSPITAL_BASED_OUTPATIENT_CLINIC_OR_DEPARTMENT_OTHER)
Admission: RE | Admit: 2017-04-02 | Discharge: 2017-04-02 | Disposition: A | Discharge status: Home / Self Care | Payer: BC Managed Care – PPO | Source: Ambulatory Visit | Attending: General Surgery | Admitting: General Surgery

## 2017-04-02 DIAGNOSIS — Z170 Estrogen receptor positive status [ER+]: Principal | ICD-10-CM

## 2017-04-02 DIAGNOSIS — C50412 Malignant neoplasm of upper-outer quadrant of left female breast: Secondary | ICD-10-CM

## 2017-04-02 DIAGNOSIS — K219 Gastro-esophageal reflux disease without esophagitis: Secondary | ICD-10-CM | POA: Insufficient documentation

## 2017-04-02 DIAGNOSIS — I10 Essential (primary) hypertension: Secondary | ICD-10-CM | POA: Insufficient documentation

## 2017-04-02 HISTORY — PX: BREAST LUMPECTOMY WITH RADIOACTIVE SEED AND SENTINEL LYMPH NODE BIOPSY: SHX6550

## 2017-04-02 HISTORY — DX: Malignant (primary) neoplasm, unspecified: C80.1

## 2017-04-02 SURGERY — BREAST LUMPECTOMY WITH RADIOACTIVE SEED AND SENTINEL LYMPH NODE BIOPSY
Anesthesia: Regional | Site: Breast | Laterality: Left

## 2017-04-02 MED ORDER — OXYCODONE HCL 5 MG PO TABS
5.0000 mg | ORAL_TABLET | ORAL | Status: DC | PRN
Start: 2017-04-02 — End: 2017-04-02

## 2017-04-02 MED ORDER — ONDANSETRON HCL 4 MG/2ML IJ SOLN: 4.0000 mg | Freq: Once | INTRAMUSCULAR | Status: DC | PRN

## 2017-04-02 MED ORDER — CHLORHEXIDINE GLUCONATE CLOTH 2 % EX PADS: 6.0000 | MEDICATED_PAD | Freq: Once | CUTANEOUS | Status: DC

## 2017-04-02 MED ORDER — SCOPOLAMINE 1 MG/3DAYS TD PT72: 1.0000 | MEDICATED_PATCH | Freq: Once | TRANSDERMAL | Status: DC | PRN

## 2017-04-02 MED ORDER — MIDAZOLAM HCL 2 MG/2ML IJ SOLN
INTRAMUSCULAR | Status: AC
  Filled 2017-04-02: qty 2

## 2017-04-02 MED ORDER — SODIUM CHLORIDE 0.9 % IJ SOLN
INTRAMUSCULAR | Status: AC
  Filled 2017-04-02: qty 10

## 2017-04-02 MED ORDER — ONDANSETRON HCL 4 MG/2ML IJ SOLN
INTRAMUSCULAR | Status: DC | PRN
  Administered 2017-04-02: 4 mg via INTRAVENOUS

## 2017-04-02 MED ORDER — FENTANYL CITRATE (PF) 100 MCG/2ML IJ SOLN
INTRAMUSCULAR | Status: AC
  Filled 2017-04-02: qty 2

## 2017-04-02 MED ORDER — ACETAMINOPHEN 650 MG RE SUPP
650.0000 mg | RECTAL | Status: DC | PRN
Start: 2017-04-02 — End: 2017-04-02

## 2017-04-02 MED ORDER — LIDOCAINE-EPINEPHRINE (PF) 1 %-1:200000 IJ SOLN
INTRAMUSCULAR | Status: AC
  Filled 2017-04-02: qty 30

## 2017-04-02 MED ORDER — FENTANYL CITRATE (PF) 100 MCG/2ML IJ SOLN: 25.0000 ug | INTRAMUSCULAR | Status: DC | PRN

## 2017-04-02 MED ORDER — LACTATED RINGERS IV SOLN
INTRAVENOUS | Status: DC
  Administered 2017-04-02 (×2): via INTRAVENOUS

## 2017-04-02 MED ORDER — SODIUM CHLORIDE 0.9% FLUSH: 3.0000 mL | INTRAVENOUS | Status: DC | PRN

## 2017-04-02 MED ORDER — TECHNETIUM TC 99M SULFUR COLLOID FILTERED
1.0000 | Freq: Once | INTRAVENOUS | Status: AC | PRN
  Administered 2017-04-02: 1 via INTRADERMAL

## 2017-04-02 MED ORDER — SODIUM CHLORIDE 0.9% FLUSH: 3.0000 mL | Freq: Two times a day (BID) | INTRAVENOUS | Status: DC

## 2017-04-02 MED ORDER — CEFAZOLIN SODIUM-DEXTROSE 2-4 GM/100ML-% IV SOLN
INTRAVENOUS | Status: AC
  Filled 2017-04-02: qty 100

## 2017-04-02 MED ORDER — DEXAMETHASONE SODIUM PHOSPHATE 4 MG/ML IJ SOLN
INTRAMUSCULAR | Status: DC | PRN
  Administered 2017-04-02: 10 mg via INTRAVENOUS

## 2017-04-02 MED ORDER — LIDOCAINE-EPINEPHRINE (PF) 1 %-1:200000 IJ SOLN
INTRAMUSCULAR | Status: DC | PRN
  Administered 2017-04-02: 20 mL

## 2017-04-02 MED ORDER — HYDROCODONE-ACETAMINOPHEN 5-325 MG PO TABS: 1.0000 | ORAL_TABLET | Freq: Four times a day (QID) | ORAL | 0 refills | Status: DC | PRN

## 2017-04-02 MED ORDER — CEFAZOLIN SODIUM-DEXTROSE 2-4 GM/100ML-% IV SOLN
2.0000 g | INTRAVENOUS | Status: AC
  Administered 2017-04-02: 2 g via INTRAVENOUS

## 2017-04-02 MED ORDER — SODIUM CHLORIDE 0.9 % IV SOLN: 250.0000 mL | INTRAVENOUS | Status: DC | PRN

## 2017-04-02 MED ORDER — MIDAZOLAM HCL 2 MG/2ML IJ SOLN
INTRAMUSCULAR | Status: AC
Start: 2017-04-02 — End: 2017-04-02
  Filled 2017-04-02: qty 2

## 2017-04-02 MED ORDER — MIDAZOLAM HCL 2 MG/2ML IJ SOLN
1.0000 mg | INTRAMUSCULAR | Status: DC | PRN
  Administered 2017-04-02 (×2): 2 mg via INTRAVENOUS

## 2017-04-02 MED ORDER — ACETAMINOPHEN 325 MG PO TABS: 650.0000 mg | ORAL_TABLET | ORAL | Status: DC | PRN

## 2017-04-02 MED ORDER — METHYLENE BLUE 0.5 % INJ SOLN
INTRAVENOUS | Status: AC
  Filled 2017-04-02: qty 10

## 2017-04-02 MED ORDER — FENTANYL CITRATE (PF) 100 MCG/2ML IJ SOLN
50.0000 ug | INTRAMUSCULAR | Status: DC | PRN
Start: 2017-04-02 — End: 2017-04-02
  Administered 2017-04-02 (×2): 100 ug via INTRAVENOUS

## 2017-04-02 SURGICAL SUPPLY — 68 items
ADH SKN CLS APL DERMABOND .7 (GAUZE/BANDAGES/DRESSINGS) ×1
BINDER BREAST LRG (GAUZE/BANDAGES/DRESSINGS) IMPLANT
BINDER BREAST MEDIUM (GAUZE/BANDAGES/DRESSINGS) IMPLANT
BINDER BREAST XLRG (GAUZE/BANDAGES/DRESSINGS) ×2 IMPLANT
BINDER BREAST XXLRG (GAUZE/BANDAGES/DRESSINGS) IMPLANT
BLADE SURG 10 STRL SS (BLADE) ×3 IMPLANT
BLADE SURG 15 STRL LF DISP TIS (BLADE) ×1 IMPLANT
BLADE SURG 15 STRL SS (BLADE) ×3
BNDG COHESIVE 4X5 TAN STRL (GAUZE/BANDAGES/DRESSINGS) ×3 IMPLANT
CANISTER SUC SOCK COL 7IN (MISCELLANEOUS) IMPLANT
CANISTER SUCT 1200ML W/VALVE (MISCELLANEOUS) ×3 IMPLANT
CHLORAPREP W/TINT 26ML (MISCELLANEOUS) ×3 IMPLANT
CLIP TI LARGE 6 (CLIP) ×3 IMPLANT
CLIP TI MEDIUM 6 (CLIP) ×6 IMPLANT
CLIP TI WIDE RED SMALL 6 (CLIP) IMPLANT
CLOSURE WOUND 1/2 X4 (GAUZE/BANDAGES/DRESSINGS) ×1
COVER MAYO STAND STRL (DRAPES) ×5 IMPLANT
COVER PROBE W GEL 5X96 (DRAPES) ×3 IMPLANT
DECANTER SPIKE VIAL GLASS SM (MISCELLANEOUS) IMPLANT
DERMABOND ADVANCED (GAUZE/BANDAGES/DRESSINGS) ×2
DERMABOND ADVANCED .7 DNX12 (GAUZE/BANDAGES/DRESSINGS) ×1 IMPLANT
DEVICE DUBIN W/COMP PLATE 8390 (MISCELLANEOUS) ×3 IMPLANT
DRAPE UTILITY XL STRL (DRAPES) ×3 IMPLANT
DRSG PAD ABDOMINAL 8X10 ST (GAUZE/BANDAGES/DRESSINGS) ×3 IMPLANT
ELECT COATED BLADE 2.86 ST (ELECTRODE) ×3 IMPLANT
ELECT REM PT RETURN 9FT ADLT (ELECTROSURGICAL) ×3
ELECTRODE REM PT RTRN 9FT ADLT (ELECTROSURGICAL) ×1 IMPLANT
GAUZE SPONGE 4X4 12PLY STRL LF (GAUZE/BANDAGES/DRESSINGS) ×3 IMPLANT
GLOVE BIO SURGEON STRL SZ 6 (GLOVE) ×3 IMPLANT
GLOVE BIO SURGEON STRL SZ 6.5 (GLOVE) ×1 IMPLANT
GLOVE BIO SURGEONS STRL SZ 6.5 (GLOVE) ×1
GLOVE BIOGEL PI IND STRL 6.5 (GLOVE) ×1 IMPLANT
GLOVE BIOGEL PI IND STRL 7.0 (GLOVE) IMPLANT
GLOVE BIOGEL PI INDICATOR 6.5 (GLOVE) ×2
GLOVE BIOGEL PI INDICATOR 7.0 (GLOVE) ×2
GLOVE EXAM NITRILE EXT CUFF MD (GLOVE) ×2 IMPLANT
GOWN STRL REUS W/ TWL LRG LVL3 (GOWN DISPOSABLE) ×1 IMPLANT
GOWN STRL REUS W/TWL 2XL LVL3 (GOWN DISPOSABLE) ×3 IMPLANT
GOWN STRL REUS W/TWL LRG LVL3 (GOWN DISPOSABLE) ×6
KIT MARKER MARGIN INK (KITS) ×3 IMPLANT
LIGHT WAVEGUIDE WIDE FLAT (MISCELLANEOUS) ×2 IMPLANT
NDL HYPO 25X1 1.5 SAFETY (NEEDLE) ×1 IMPLANT
NDL SAFETY ECLIPSE 18X1.5 (NEEDLE) IMPLANT
NEEDLE HYPO 18GX1.5 SHARP (NEEDLE)
NEEDLE HYPO 25X1 1.5 SAFETY (NEEDLE) ×3 IMPLANT
NS IRRIG 1000ML POUR BTL (IV SOLUTION) ×3 IMPLANT
PACK BASIN DAY SURGERY FS (CUSTOM PROCEDURE TRAY) ×3 IMPLANT
PACK UNIVERSAL I (CUSTOM PROCEDURE TRAY) ×3 IMPLANT
PENCIL BUTTON HOLSTER BLD 10FT (ELECTRODE) ×3 IMPLANT
SLEEVE SCD COMPRESS KNEE MED (MISCELLANEOUS) ×3 IMPLANT
SPONGE LAP 18X18 X RAY DECT (DISPOSABLE) ×6 IMPLANT
STAPLER VISISTAT 35W (STAPLE) IMPLANT
STOCKINETTE IMPERVIOUS LG (DRAPES) ×3 IMPLANT
STRIP CLOSURE SKIN 1/2X4 (GAUZE/BANDAGES/DRESSINGS) ×2 IMPLANT
SUT ETHILON 2 0 FS 18 (SUTURE) IMPLANT
SUT MNCRL AB 4-0 PS2 18 (SUTURE) ×3 IMPLANT
SUT MON AB 5-0 PS2 18 (SUTURE) IMPLANT
SUT SILK 2 0 SH (SUTURE) IMPLANT
SUT VIC AB 2-0 SH 27 (SUTURE) ×3
SUT VIC AB 2-0 SH 27XBRD (SUTURE) ×1 IMPLANT
SUT VIC AB 3-0 SH 27 (SUTURE) ×3
SUT VIC AB 3-0 SH 27X BRD (SUTURE) ×1 IMPLANT
SYR CONTROL 10ML LL (SYRINGE) ×3 IMPLANT
TOWEL OR 17X24 6PK STRL BLUE (TOWEL DISPOSABLE) ×3 IMPLANT
TOWEL OR NON WOVEN STRL DISP B (DISPOSABLE) IMPLANT
TUBE CONNECTING 20'X1/4 (TUBING) ×1
TUBE CONNECTING 20X1/4 (TUBING) ×2 IMPLANT
YANKAUER SUCT BULB TIP NO VENT (SUCTIONS) ×3 IMPLANT

## 2017-04-02 NOTE — Anesthesia Postprocedure Evaluation (Signed)
Anesthesia Post Note  Patient: [name]  Procedure(s) Performed: Procedure(s) (LRB): LEFT BREAST LUMPECTOMY WITH RADIOACTIVE SEED AND SENTINEL LYMPH NODE BIOPSY (Left)  Patient location during evaluation: PACU Anesthesia Type: Regional and General Level of consciousness: awake, awake and alert and oriented Pain management: pain level controlled Vital Signs Assessment: post-procedure vital signs reviewed and stable Respiratory status: spontaneous breathing, nonlabored ventilation and respiratory function stable Cardiovascular status: blood pressure returned to baseline Anesthetic complications: no       Last Vitals:  Vitals:   04/02/17 1315 04/02/17 1342  BP:  138/82  Pulse: 86 76  Resp: 13 12  Temp:  36.5 C    Last Pain:  Vitals:   04/02/17 1342  TempSrc: Oral  PainSc: 3                  Howie Rufus COKER

## 2017-04-02 NOTE — Anesthesia Procedure Notes (Signed)
Procedure Name: LMA Insertion Date/Time: 04/02/2017 10:38 AM Performed by: Melynda Ripple D Pre-anesthesia Checklist: Patient identified, Emergency Drugs available, Suction available and Patient being monitored Patient Re-evaluated:Patient Re-evaluated prior to inductionOxygen Delivery Method: Circle system utilized Preoxygenation: Pre-oxygenation with 100% oxygen Intubation Type: IV induction Ventilation: Mask ventilation without difficulty LMA: LMA inserted LMA Size: 4.0 Number of attempts: 1 Airway Equipment and Method: Bite block Placement Confirmation: positive ETCO2 Tube secured with: Tape Dental Injury: Teeth and Oropharynx as per pre-operative assessment

## 2017-04-02 NOTE — Anesthesia Procedure Notes (Signed)
Anesthesia Regional Block: Pectoralis block   Pre-Anesthetic Checklist: ,, timeout performed, Correct Patient, Correct Site, Correct Laterality, Correct Procedure, Correct Position, site marked, Risks and benefits discussed,  Surgical consent,  Pre-op evaluation,  At surgeon's request and post-op pain management  Laterality: Left  Prep: chloraprep       Needles:  Injection technique: Single-shot  Needle Type: Echogenic Stimulator Needle          Additional Needles:   Procedures: ultrasound guided,,,,,,,,  Narrative:  Start time: 04/02/2017 9:50 AM End time: 04/02/2017 9:55 AM Injection made incrementally with aspirations every 5 mL.  Performed by: Personally   Additional Notes: 30 cc 0.5% Bupivacaine with 1:200 epi injected easily

## 2017-04-02 NOTE — Anesthesia Postprocedure Evaluation (Signed)
Anesthesia Post Note  Patient: DEATRICE SPANBAUER  Procedure(s) Performed: Procedure(s) (LRB): LEFT BREAST LUMPECTOMY WITH RADIOACTIVE SEED AND SENTINEL LYMPH NODE BIOPSY (Left)  Patient location during evaluation: PACU Anesthesia Type: Regional and General Level of consciousness: awake and alert Pain management: pain level controlled Vital Signs Assessment: post-procedure vital signs reviewed and stable Respiratory status: spontaneous breathing, nonlabored ventilation, respiratory function stable and patient connected to nasal cannula oxygen Cardiovascular status: blood pressure returned to baseline and stable Postop Assessment: no signs of nausea or vomiting Anesthetic complications: no       Last Vitals:  Vitals:   04/02/17 1200 04/02/17 1215  BP: 134/71 128/71  Pulse: (!) 107 92  Resp: (!) 23 (!) 6  Temp: 36.4 C     Last Pain:  Vitals:   04/02/17 0825  TempSrc: Oral                 Montez Hageman

## 2017-04-02 NOTE — Anesthesia Preprocedure Evaluation (Addendum)
Anesthesia Evaluation  Patient identified by MRN, date of birth, ID band Patient awake    Reviewed: Allergy & Precautions, NPO status , Patient's Chart, lab work & pertinent test results  Airway Mallampati: II  TM Distance: >3 FB Neck ROM: Full    Dental  (+) Teeth Intact, Dental Advisory Given   Pulmonary    breath sounds clear to auscultation       Cardiovascular hypertension,  Rhythm:Regular Rate:Normal     Neuro/Psych    GI/Hepatic   Endo/Other    Renal/GU      Musculoskeletal   Abdominal   Peds  Hematology   Anesthesia Other Findings   Reproductive/Obstetrics                            Anesthesia Physical Anesthesia Plan  ASA: II  Anesthesia Plan: General and Regional   Post-op Pain Management:    Induction: Intravenous  Airway Management Planned: LMA  Additional Equipment:   Intra-op Plan:   Post-operative Plan:   Informed Consent: I have reviewed the patients History and Physical, chart, labs and discussed the procedure including the risks, benefits and alternatives for the proposed anesthesia with the patient or authorized representative who has indicated his/her understanding and acceptance.   Dental advisory given  Plan Discussed with: CRNA and Anesthesiologist  Anesthesia Plan Comments:         Anesthesia Quick Evaluation

## 2017-04-02 NOTE — Discharge Instructions (Addendum)
°Post Anesthesia Home Care Instructions ° °Activity: °Get plenty of rest for the remainder of the day. A responsible individual must stay with you for 24 hours following the procedure.  °For the next 24 hours, DO NOT: °-Drive a car °-Operate machinery °-Drink alcoholic beverages °-Take any medication unless instructed by your physician °-Make any legal decisions or sign important papers. ° °Meals: °Start with liquid foods such as gelatin or soup. Progress to regular foods as tolerated. Avoid greasy, spicy, heavy foods. If nausea and/or vomiting occur, drink only clear liquids until the nausea and/or vomiting subsides. Call your physician if vomiting continues. ° °Special Instructions/Symptoms: °Your throat may feel dry or sore from the anesthesia or the breathing tube placed in your throat during surgery. If this causes discomfort, gargle with warm salt water. The discomfort should disappear within 24 hours. ° °If you had a scopolamine patch placed behind your ear for the management of post- operative nausea and/or vomiting: ° °1. The medication in the patch is effective for 72 hours, after which it should be removed.  Wrap patch in a tissue and discard in the trash. Wash hands thoroughly with soap and water. °2. You may remove the patch earlier than 72 hours if you experience unpleasant side effects which may include dry mouth, dizziness or visual disturbances. °3. Avoid touching the patch. Wash your hands with soap and water after contact with the patch. °   °[facility] °Office Phone Number [phone] ° °BREAST BIOPSY/ PARTIAL MASTECTOMY: POST OP INSTRUCTIONS ° °Always review your discharge instruction sheet given to you by the facility where your surgery was performed. ° °IF YOU HAVE DISABILITY OR FAMILY LEAVE FORMS, YOU MUST BRING THEM TO THE OFFICE FOR PROCESSING.  DO NOT GIVE THEM TO YOUR DOCTOR. ° °1. A prescription for pain medication may be given to you upon discharge.  Take your pain  medication as prescribed, if needed.  If narcotic pain medicine is not needed, then you may take acetaminophen (Tylenol) or ibuprofen (Advil) as needed. °2. Take your usually prescribed medications unless otherwise directed °3. If you need a refill on your pain medication, please contact your pharmacy.  They will contact our office to request authorization.  Prescriptions will not be filled after 5pm or on week-ends. °4. You should eat very light the first 24 hours after surgery, such as soup, crackers, pudding, etc.  Resume your normal diet the day after surgery. °5. Most patients will experience some swelling and bruising in the breast.  Ice packs and a good support bra will help.  Swelling and bruising can take several days to resolve.  °6. It is common to experience some constipation if taking pain medication after surgery.  Increasing fluid intake and taking a stool softener will usually help or prevent this problem from occurring.  A mild laxative (Milk of Magnesia or Miralax) should be taken according to package directions if there are no bowel movements after 48 hours. °7. Unless discharge instructions indicate otherwise, you may remove your bandages 48 hours after surgery, and you may shower at that time.  You may have steri-strips (small skin tapes) in place directly over the incision.  These strips should be left on the skin for 7-10 days.   Any sutures or staples will be removed at the office during your follow-up visit. °8. ACTIVITIES:  You may resume regular daily activities (gradually increasing) beginning the next day.  Wearing a good support bra or sports bra (or the breast binder) minimizes   pain and swelling.  You may have sexual intercourse when it is comfortable. °a. You may drive when you no longer are taking prescription pain medication, you can comfortably wear a seatbelt, and you can safely maneuver your car and apply brakes. °b. RETURN TO WORK:  __________1 week_______________ °9. You should  see your doctor in the office for a follow-up appointment approximately two weeks after your surgery.  Your doctor’s nurse will typically make your follow-up appointment when she calls you with your pathology report.  Expect your pathology report 2-3 business days after your surgery.  You may call to check if you do not hear from us after three days. ° ° °WHEN TO CALL YOUR DOCTOR: °1. Fever over 101.0 °2. Nausea and/or vomiting. °3. Extreme swelling or bruising. °4. Continued bleeding from incision. °5. Increased pain, redness, or drainage from the incision. ° °The clinic staff is available to answer your questions during regular business hours.  Please don’t hesitate to call and ask to speak to one of the nurses for clinical concerns.  If you have a medical emergency, go to the nearest emergency room or call 911.  A surgeon from [facility] is always on call at the hospital. ° °For further questions, please visit [email or web address]  ° °

## 2017-04-02 NOTE — Transfer of Care (Signed)
Immediate Anesthesia Transfer of Care Note  Patient: Shelly Beasley  Procedure(s) Performed: Procedure(s): LEFT BREAST LUMPECTOMY WITH RADIOACTIVE SEED AND SENTINEL LYMPH NODE BIOPSY (Left)  Patient Location: PACU  Anesthesia Type:General  Level of Consciousness: awake, alert  and oriented  Airway & Oxygen Therapy: Patient Spontanous Breathing and Patient connected to face mask oxygen  Post-op Assessment: Report given to RN and Post -op Vital signs reviewed and stable  Post vital signs: Reviewed and stable  Last Vitals:  Vitals:   04/02/17 0945 04/02/17 1000  BP: 112/70 116/71  Pulse: 70 79  Resp: 17 16  Temp:      Last Pain:  Vitals:   04/02/17 0825  TempSrc: Oral         Complications: No apparent anesthesia complications

## 2017-04-02 NOTE — Progress Notes (Signed)
Assisted Dr. Joslin with left, ultrasound guided, pectoralis block. Side rails up, monitors on throughout procedure. See vital signs in flow sheet. Tolerated Procedure well. 

## 2017-04-02 NOTE — Op Note (Signed)
Left Breast Radioactive seed localized lumpectomy and sentinel lymph node biopsy  Indications: This patient presents with history of Left breast cancer, upper outer quadrant, cT1cN0, +/+/-  Pre-operative Diagnosis: left breast cancer  Post-operative Diagnosis: Same  Surgeon: Stark Klein   Anesthesia: General endotracheal anesthesia  ASA Class: 2  Procedure Details  The patient was seen in the Holding Room. The risks, benefits, complications, treatment options, and expected outcomes were discussed with the patient. The possibilities of bleeding, infection, the need for additional procedures, failure to diagnose a condition, and creating a complication requiring transfusion or operation were discussed with the patient. The patient concurred with the proposed plan, giving informed consent.  The site of surgery properly noted/marked. The patient was taken to Operating Room # 2, identified, and the procedure verified as left Breast seed localized lumpectomy with sentinel lymph node biopsy. A Time Out was held and the above information confirmed.  The left arm, breast, and chest were prepped and draped in standard fashion. The lumpectomy was performed by creating an axillary incision near the previously placed radioactive seed.  Dissection was carried down to around the point of maximum signal intensity. The cautery was used to perform the dissection.  Hemostasis was achieved with cautery. An additional superior margin was taken. The edges of the cavity were marked with large clips, with one each medial, lateral, inferior and superior, and two clips posteriorly.   The specimen was inked with the margin marker paint kit.    Specimen radiography confirmed inclusion of the mammographic lesion, the clip, and the seed.  The background signal in the breast was zero.   The same incision was used for the sentinel lymph node biopsy.  Dissection was carried through the clavipectoral fascia.  Three level 2 (deep)  axillary sentinel nodes were removed.  Counts per second were 1510, 220, and 60.    The background count was 5 cps.  The wound was irrigated.  Hemostasis was achieved with cautery.  The axillary incision was closed with a 3-0 vicryl deep dermal interrupted sutures and a 4-0 monocryl subcuticular closure.    Sterile dressings were applied. At the end of the operation, all sponge, instrument, and needle counts were correct.  Findings: grossly clear surgical margins and no adenopathy, Posterior margin is pectoralis.    Estimated Blood Loss:  min         Specimens: left breast lumpectomy, additional superior margin, and 3 left axillary sentinel lymph nodes.             Complications:  None; patient tolerated the procedure well.         Disposition: PACU - hemodynamically stable.         Condition: stable

## 2017-04-02 NOTE — Interval H&P Note (Signed)
History and Physical Interval Note:  04/02/2017 9:51 AM  Shelly Beasley  has presented today for surgery, with the diagnosis of LEFT BREAST CANCER  The various methods of treatment have been discussed with the patient and family. After consideration of risks, benefits and other options for treatment, the patient has consented to  Procedure(s): LEFT BREAST LUMPECTOMY WITH RADIOACTIVE SEED AND SENTINEL LYMPH NODE BIOPSY (Left) as a surgical intervention .  The patient's history has been reviewed, patient examined, no change in status, stable for surgery.  I have reviewed the patient's chart and labs.  Questions were answered to the patient's satisfaction.     Shelly Beasley

## 2017-04-03 ENCOUNTER — Encounter (HOSPITAL_BASED_OUTPATIENT_CLINIC_OR_DEPARTMENT_OTHER): Payer: Self-pay | Admitting: General Surgery

## 2017-04-03 NOTE — Addendum Note (Signed)
Addendum  created 04/03/17 0850 by Tawni Millers, CRNA   Charge Capture section accepted

## 2017-04-08 ENCOUNTER — Other Ambulatory Visit: Payer: Self-pay | Admitting: General Surgery

## 2017-04-09 ENCOUNTER — Telehealth: Payer: Self-pay | Admitting: General Surgery

## 2017-04-09 ENCOUNTER — Telehealth: Payer: Self-pay | Admitting: *Deleted

## 2017-04-09 NOTE — Telephone Encounter (Signed)
Received order for oncotype testing. Requisition sent to pathology. Received by Keisha 

## 2017-04-09 NOTE — Telephone Encounter (Signed)
Left message about negative LN and need for reexcision for margins.  Tried both numbers.  Will turn in orders.

## 2017-04-14 ENCOUNTER — Telehealth: Payer: Self-pay | Admitting: General Surgery

## 2017-04-14 ENCOUNTER — Encounter (HOSPITAL_BASED_OUTPATIENT_CLINIC_OR_DEPARTMENT_OTHER): Payer: Self-pay | Admitting: *Deleted

## 2017-04-14 NOTE — Telephone Encounter (Signed)
Reviewed need for reexcision with patient.

## 2017-04-15 NOTE — Progress Notes (Signed)
Pt in to pick up Boost Breeze.Instructions reviewed.

## 2017-04-16 ENCOUNTER — Ambulatory Visit (HOSPITAL_BASED_OUTPATIENT_CLINIC_OR_DEPARTMENT_OTHER): Payer: BC Managed Care – PPO | Admitting: Anesthesiology

## 2017-04-16 ENCOUNTER — Encounter (HOSPITAL_BASED_OUTPATIENT_CLINIC_OR_DEPARTMENT_OTHER): Payer: Self-pay

## 2017-04-16 ENCOUNTER — Ambulatory Visit (HOSPITAL_BASED_OUTPATIENT_CLINIC_OR_DEPARTMENT_OTHER)
Admission: RE | Admit: 2017-04-16 | Discharge: 2017-04-16 | Disposition: A | Discharge status: Home / Self Care | Payer: BC Managed Care – PPO | Source: Ambulatory Visit | Attending: General Surgery | Admitting: General Surgery

## 2017-04-16 ENCOUNTER — Encounter (HOSPITAL_BASED_OUTPATIENT_CLINIC_OR_DEPARTMENT_OTHER)
Admission: RE | Disposition: A | Discharge status: Home / Self Care | Payer: Self-pay | Source: Ambulatory Visit | Attending: General Surgery

## 2017-04-16 DIAGNOSIS — Z170 Estrogen receptor positive status [ER+]: Secondary | ICD-10-CM | POA: Diagnosis not present

## 2017-04-16 DIAGNOSIS — C50412 Malignant neoplasm of upper-outer quadrant of left female breast: Secondary | ICD-10-CM | POA: Insufficient documentation

## 2017-04-16 DIAGNOSIS — I10 Essential (primary) hypertension: Secondary | ICD-10-CM | POA: Insufficient documentation

## 2017-04-16 DIAGNOSIS — K219 Gastro-esophageal reflux disease without esophagitis: Secondary | ICD-10-CM | POA: Insufficient documentation

## 2017-04-16 HISTORY — PX: RE-EXCISION OF BREAST LUMPECTOMY: SHX6048

## 2017-04-16 SURGERY — EXCISION, LESION, BREAST
Anesthesia: General | Site: Breast | Laterality: Left

## 2017-04-16 MED ORDER — LACTATED RINGERS IV SOLN: INTRAVENOUS | Status: DC

## 2017-04-16 MED ORDER — CELECOXIB 200 MG PO CAPS
ORAL_CAPSULE | ORAL | Status: AC
  Filled 2017-04-16: qty 2

## 2017-04-16 MED ORDER — SUCCINYLCHOLINE CHLORIDE 200 MG/10ML IV SOSY
PREFILLED_SYRINGE | INTRAVENOUS | Status: AC
  Filled 2017-04-16: qty 10

## 2017-04-16 MED ORDER — CEFAZOLIN SODIUM-DEXTROSE 2-4 GM/100ML-% IV SOLN
2.0000 g | INTRAVENOUS | Status: AC
  Administered 2017-04-16: 2 g via INTRAVENOUS

## 2017-04-16 MED ORDER — LIDOCAINE HCL (CARDIAC) 20 MG/ML IV SOLN
INTRAVENOUS | Status: DC | PRN
  Administered 2017-04-16: 60 mg via INTRAVENOUS

## 2017-04-16 MED ORDER — PROPOFOL 10 MG/ML IV BOLUS
INTRAVENOUS | Status: DC | PRN
  Administered 2017-04-16: 170 mg via INTRAVENOUS

## 2017-04-16 MED ORDER — EPHEDRINE 5 MG/ML INJ
INTRAVENOUS | Status: AC
  Filled 2017-04-16: qty 10

## 2017-04-16 MED ORDER — SODIUM CHLORIDE 0.9% FLUSH: 3.0000 mL | Freq: Two times a day (BID) | INTRAVENOUS | Status: DC

## 2017-04-16 MED ORDER — METOCLOPRAMIDE HCL 5 MG/ML IJ SOLN: 10.0000 mg | Freq: Once | INTRAMUSCULAR | Status: DC | PRN

## 2017-04-16 MED ORDER — CHLORHEXIDINE GLUCONATE CLOTH 2 % EX PADS: 6.0000 | MEDICATED_PAD | Freq: Once | CUTANEOUS | Status: DC

## 2017-04-16 MED ORDER — DEXAMETHASONE SODIUM PHOSPHATE 4 MG/ML IJ SOLN
INTRAMUSCULAR | Status: DC | PRN
  Administered 2017-04-16: 10 mg via INTRAVENOUS

## 2017-04-16 MED ORDER — CELECOXIB 400 MG PO CAPS: 400.0000 mg | ORAL_CAPSULE | ORAL | Status: DC

## 2017-04-16 MED ORDER — BUPIVACAINE-EPINEPHRINE (PF) 0.5% -1:200000 IJ SOLN
INTRAMUSCULAR | Status: AC
  Filled 2017-04-16: qty 30

## 2017-04-16 MED ORDER — ONDANSETRON HCL 4 MG/2ML IJ SOLN
INTRAMUSCULAR | Status: DC | PRN
  Administered 2017-04-16: 4 mg via INTRAVENOUS

## 2017-04-16 MED ORDER — ACETAMINOPHEN 325 MG PO TABS: 650.0000 mg | ORAL_TABLET | ORAL | Status: DC | PRN

## 2017-04-16 MED ORDER — DEXAMETHASONE SODIUM PHOSPHATE 10 MG/ML IJ SOLN
INTRAMUSCULAR | Status: AC
  Filled 2017-04-16: qty 1

## 2017-04-16 MED ORDER — MIDAZOLAM HCL 2 MG/2ML IJ SOLN
1.0000 mg | INTRAMUSCULAR | Status: DC | PRN
  Administered 2017-04-16: 2 mg via INTRAVENOUS

## 2017-04-16 MED ORDER — FENTANYL CITRATE (PF) 100 MCG/2ML IJ SOLN
INTRAMUSCULAR | Status: AC
  Filled 2017-04-16: qty 2

## 2017-04-16 MED ORDER — LACTATED RINGERS IV SOLN
INTRAVENOUS | Status: DC
  Administered 2017-04-16 (×2): via INTRAVENOUS

## 2017-04-16 MED ORDER — FENTANYL CITRATE (PF) 100 MCG/2ML IJ SOLN: 25.0000 ug | INTRAMUSCULAR | Status: DC | PRN

## 2017-04-16 MED ORDER — LIDOCAINE 2% (20 MG/ML) 5 ML SYRINGE
INTRAMUSCULAR | Status: AC
  Filled 2017-04-16: qty 5

## 2017-04-16 MED ORDER — LIDOCAINE-EPINEPHRINE (PF) 1 %-1:200000 IJ SOLN
INTRAMUSCULAR | Status: DC | PRN
  Administered 2017-04-16: 30 mL via INTRAMUSCULAR

## 2017-04-16 MED ORDER — OXYCODONE HCL 5 MG PO TABS
ORAL_TABLET | ORAL | Status: AC
  Filled 2017-04-16: qty 1

## 2017-04-16 MED ORDER — CEFAZOLIN SODIUM-DEXTROSE 2-4 GM/100ML-% IV SOLN
INTRAVENOUS | Status: AC
  Filled 2017-04-16: qty 100

## 2017-04-16 MED ORDER — MIDAZOLAM HCL 2 MG/2ML IJ SOLN
INTRAMUSCULAR | Status: AC
  Filled 2017-04-16: qty 2

## 2017-04-16 MED ORDER — SCOPOLAMINE 1 MG/3DAYS TD PT72: 1.0000 | MEDICATED_PATCH | Freq: Once | TRANSDERMAL | Status: DC | PRN

## 2017-04-16 MED ORDER — LIDOCAINE-EPINEPHRINE (PF) 1 %-1:200000 IJ SOLN
INTRAMUSCULAR | Status: AC
  Filled 2017-04-16: qty 30

## 2017-04-16 MED ORDER — OXYCODONE HCL 5 MG PO TABS
5.0000 mg | ORAL_TABLET | ORAL | Status: DC | PRN
  Administered 2017-04-16: 5 mg via ORAL

## 2017-04-16 MED ORDER — MEPERIDINE HCL 25 MG/ML IJ SOLN
6.2500 mg | INTRAMUSCULAR | Status: DC | PRN
Start: 2017-04-16 — End: 2017-04-16

## 2017-04-16 MED ORDER — ACETAMINOPHEN 500 MG PO TABS
1000.0000 mg | ORAL_TABLET | ORAL | Status: AC
  Administered 2017-04-16: 1000 mg via ORAL

## 2017-04-16 MED ORDER — ACETAMINOPHEN 650 MG RE SUPP: 650.0000 mg | RECTAL | Status: DC | PRN

## 2017-04-16 MED ORDER — PHENYLEPHRINE 40 MCG/ML (10ML) SYRINGE FOR IV PUSH (FOR BLOOD PRESSURE SUPPORT)
PREFILLED_SYRINGE | INTRAVENOUS | Status: AC
  Filled 2017-04-16: qty 10

## 2017-04-16 MED ORDER — SODIUM CHLORIDE 0.9% FLUSH: 3.0000 mL | INTRAVENOUS | Status: DC | PRN

## 2017-04-16 MED ORDER — SODIUM CHLORIDE 0.9 % IV SOLN: 250.0000 mL | INTRAVENOUS | Status: DC | PRN

## 2017-04-16 MED ORDER — FENTANYL CITRATE (PF) 100 MCG/2ML IJ SOLN
50.0000 ug | INTRAMUSCULAR | Status: DC | PRN
  Administered 2017-04-16: 100 ug via INTRAVENOUS

## 2017-04-16 MED ORDER — ONDANSETRON HCL 4 MG/2ML IJ SOLN
INTRAMUSCULAR | Status: AC
  Filled 2017-04-16: qty 2

## 2017-04-16 MED ORDER — BUPIVACAINE HCL (PF) 0.25 % IJ SOLN
INTRAMUSCULAR | Status: AC
  Filled 2017-04-16: qty 30

## 2017-04-16 MED ORDER — ACETAMINOPHEN 500 MG PO TABS
ORAL_TABLET | ORAL | Status: AC
  Filled 2017-04-16: qty 2

## 2017-04-16 SURGICAL SUPPLY — 58 items
ADH SKN CLS APL DERMABOND .7 (GAUZE/BANDAGES/DRESSINGS) ×1
BINDER BREAST 3XL (BIND) IMPLANT
BINDER BREAST LRG (GAUZE/BANDAGES/DRESSINGS) IMPLANT
BINDER BREAST MEDIUM (GAUZE/BANDAGES/DRESSINGS) IMPLANT
BINDER BREAST XLRG (GAUZE/BANDAGES/DRESSINGS) ×2 IMPLANT
BINDER BREAST XXLRG (GAUZE/BANDAGES/DRESSINGS) IMPLANT
BLADE SURG 10 STRL SS (BLADE) ×3 IMPLANT
BLADE SURG 15 STRL LF DISP TIS (BLADE) IMPLANT
BLADE SURG 15 STRL SS (BLADE)
CANISTER SUCT 1200ML W/VALVE (MISCELLANEOUS) ×3 IMPLANT
CHLORAPREP W/TINT 26ML (MISCELLANEOUS) ×3 IMPLANT
CLIP TI LARGE 6 (CLIP) ×3 IMPLANT
CLOSURE WOUND 1/2 X4 (GAUZE/BANDAGES/DRESSINGS) ×1
COVER BACK TABLE 60X90IN (DRAPES) ×3 IMPLANT
COVER MAYO STAND STRL (DRAPES) ×3 IMPLANT
DECANTER SPIKE VIAL GLASS SM (MISCELLANEOUS) IMPLANT
DERMABOND ADVANCED (GAUZE/BANDAGES/DRESSINGS) ×2
DERMABOND ADVANCED .7 DNX12 (GAUZE/BANDAGES/DRESSINGS) ×1 IMPLANT
DRAPE LAPAROSCOPIC ABDOMINAL (DRAPES) ×3 IMPLANT
DRAPE UTILITY XL STRL (DRAPES) ×3 IMPLANT
ELECT COATED BLADE 2.86 ST (ELECTRODE) ×3 IMPLANT
ELECT REM PT RETURN 9FT ADLT (ELECTROSURGICAL) ×3
ELECTRODE REM PT RTRN 9FT ADLT (ELECTROSURGICAL) ×1 IMPLANT
GAUZE SPONGE 4X4 12PLY STRL (GAUZE/BANDAGES/DRESSINGS) IMPLANT
GAUZE SPONGE 4X4 12PLY STRL LF (GAUZE/BANDAGES/DRESSINGS) ×3 IMPLANT
GLOVE BIO SURGEON STRL SZ 6 (GLOVE) ×3 IMPLANT
GLOVE BIOGEL PI IND STRL 6.5 (GLOVE) ×1 IMPLANT
GLOVE BIOGEL PI IND STRL 7.0 (GLOVE) IMPLANT
GLOVE BIOGEL PI INDICATOR 6.5 (GLOVE) ×2
GLOVE BIOGEL PI INDICATOR 7.0 (GLOVE) ×4
GLOVE SURG SS PI 7.0 STRL IVOR (GLOVE) ×2 IMPLANT
GOWN STRL REUS W/ TWL LRG LVL3 (GOWN DISPOSABLE) ×1 IMPLANT
GOWN STRL REUS W/TWL 2XL LVL3 (GOWN DISPOSABLE) ×3 IMPLANT
GOWN STRL REUS W/TWL LRG LVL3 (GOWN DISPOSABLE) ×6
KIT MARKER MARGIN INK (KITS) ×3 IMPLANT
LIGHT WAVEGUIDE WIDE FLAT (MISCELLANEOUS) ×2 IMPLANT
NDL HYPO 25X1 1.5 SAFETY (NEEDLE) ×1 IMPLANT
NEEDLE HYPO 25X1 1.5 SAFETY (NEEDLE) ×3 IMPLANT
NS IRRIG 1000ML POUR BTL (IV SOLUTION) ×3 IMPLANT
PACK BASIN DAY SURGERY FS (CUSTOM PROCEDURE TRAY) ×3 IMPLANT
PENCIL BUTTON HOLSTER BLD 10FT (ELECTRODE) ×3 IMPLANT
SLEEVE SCD COMPRESS KNEE MED (MISCELLANEOUS) ×3 IMPLANT
SPONGE LAP 18X18 X RAY DECT (DISPOSABLE) ×3 IMPLANT
STAPLER VISISTAT 35W (STAPLE) IMPLANT
STRIP CLOSURE SKIN 1/2X4 (GAUZE/BANDAGES/DRESSINGS) ×2 IMPLANT
SUT MON AB 4-0 PC3 18 (SUTURE) ×3 IMPLANT
SUT SILK 2 0 SH (SUTURE) IMPLANT
SUT VIC AB 3-0 54X BRD REEL (SUTURE) IMPLANT
SUT VIC AB 3-0 BRD 54 (SUTURE)
SUT VIC AB 3-0 SH 27 (SUTURE) ×3
SUT VIC AB 3-0 SH 27X BRD (SUTURE) ×1 IMPLANT
SYR BULB 3OZ (MISCELLANEOUS) ×3 IMPLANT
SYR CONTROL 10ML LL (SYRINGE) ×3 IMPLANT
TOWEL OR 17X24 6PK STRL BLUE (TOWEL DISPOSABLE) ×3 IMPLANT
TOWEL OR NON WOVEN STRL DISP B (DISPOSABLE) ×3 IMPLANT
TUBE CONNECTING 20'X1/4 (TUBING) ×1
TUBE CONNECTING 20X1/4 (TUBING) ×2 IMPLANT
YANKAUER SUCT BULB TIP NO VENT (SUCTIONS) ×3 IMPLANT

## 2017-04-16 NOTE — H&P (View-Only) (Signed)
Shelly Beasley  Location: [facility] Patient #: [id number] DOB: 16-Dec-1960 Undefined / Language: Cleophus Molt / Race: Black or African American Female   History of Present Illness  The patient is a 56 year old female who presents with breast cancer. Pt is a 56 yo F referred by Dr. Radford Pax for consultation regarding new diagnosis of left breast cancer. She had screening detected left breast mass. She was found to have a 1 cm mass in the upper outer quadrant on diagnostic imaging and a core needle biopsy was performed. This demonstrated grade 1 invasive mammary carcinoma, lobular phenotype, ER/PR positive, Her 2 negative. Breast density category C. She denies any palpable breast mass. She does not have any family history of breast cancer, but she does have a h/o lymphoma in her father and multiple myeloma in her mother. She has 3 children, the first at age 79. She had menarche at age 59.   mammogram/ultrasound FINDINGS: In the lateral aspect of the left breast, posterior depth there is an irregular 1.0 cm mass with angular an indistinct margins.  Mammographic images were processed with CAD.  Ultrasound targeted to the left breast at 130, 8 cm from the nipple demonstrates an irregular hypoechoic mass with a thin echogenic rind measuring 1.0 x 0.8 x 0.6 cm. No blood flow seen within the mass on color Doppler imaging. Ultrasound of the left axilla demonstrates multiple normal-appearing lymph nodes.  IMPRESSION: 1. There is a highly suspicious 1.0 cm mass in the left breast at 1:30.  2. No evidence of left axillary lymphadenopathy.  RECOMMENDATION: Ultrasound-guided biopsy is recommended for the left breast mass at 1:30. This has been scheduled for 01/26/2017 at 12:45 p.m.  CBC, cmet, essentially normal.   pathology Breast, left, needle core biopsy, upper outer quadrant 1:30 o'clock - INVASIVE MAMMARY CARCINOMA. The malignant cells are negative for E-Cadherin,  suggesting a lobular phenotype.   Past Surgical History Breast Biopsy  Left. Foot Surgery  Bilateral.  Diagnostic Studies History Colonoscopy  1-5 years ago Mammogram  within last year Pap Smear  1-5 years ago  Medication History Medications Reconciled  Social History Alcohol use  Occasional alcohol use. Caffeine use  Carbonated beverages, Coffee, Tea. No drug use  Tobacco use  Never smoker.  Family History Alcohol Abuse  Father. Cerebrovascular Accident  Brother. Diabetes Mellitus  Mother, Sister. Heart Disease  Mother. Hypertension  Mother, Sister. Seizure disorder  Father.  Pregnancy / Birth History Age at menarche  66 years. Age of menopause  20-50 Contraceptive History  Oral contraceptives. Gravida  3 Maternal age  103-30 Para  3  Other Problems Gastroesophageal Reflux Disease  Hemorrhoids  High blood pressure  Lump In Breast     Review of Systems  General Not Present- Appetite Loss, Chills, Fatigue, Fever, Night Sweats, Weight Gain and Weight Loss. Skin Not Present- Change in Wart/Mole, Dryness, Hives, Jaundice, New Lesions, Non-Healing Wounds, Rash and Ulcer. HEENT Present- Wears glasses/contact lenses. Not Present- Earache, Hearing Loss, Hoarseness, Nose Bleed, Oral Ulcers, Ringing in the Ears, Seasonal Allergies, Sinus Pain, Sore Throat, Visual Disturbances and Yellow Eyes. Respiratory Present- Snoring. Not Present- Bloody sputum, Chronic Cough, Difficulty Breathing and Wheezing. Breast Present- Breast Mass. Not Present- Breast Pain, Nipple Discharge and Skin Changes. Cardiovascular Not Present- Chest Pain, Difficulty Breathing Lying Down, Leg Cramps, Palpitations, Rapid Heart Rate, Shortness of Breath and Swelling of Extremities. Gastrointestinal Present- Hemorrhoids. Not Present- Abdominal Pain, Bloating, Bloody Stool, Change in Bowel Habits, Chronic diarrhea, Constipation, Difficulty Swallowing, Excessive  gas, Gets full quickly  at meals, Indigestion, Nausea, Rectal Pain and Vomiting. Female Genitourinary Not Present- Frequency, Nocturia, Painful Urination, Pelvic Pain and Urgency. Musculoskeletal Not Present- Back Pain, Joint Pain, Joint Stiffness, Muscle Pain, Muscle Weakness and Swelling of Extremities. Neurological Not Present- Decreased Memory, Fainting, Headaches, Numbness, Seizures, Tingling, Tremor, Trouble walking and Weakness. Psychiatric Not Present- Anxiety, Bipolar, Change in Sleep Pattern, Depression, Fearful and Frequent crying. Endocrine Present- Hot flashes. Not Present- Cold Intolerance, Excessive Hunger, Hair Changes, Heat Intolerance and New Diabetes.   Physical Exam  General Mental Status-Alert. General Appearance-Consistent with stated age. Hydration-Well hydrated. Voice-Normal.  Head and Neck Head-normocephalic, atraumatic with no lesions or palpable masses. Trachea-midline. Thyroid Gland Characteristics - normal size and consistency.  Eye Eyeball - Bilateral-Extraocular movements intact. Sclera/Conjunctiva - Bilateral-No scleral icterus.  Chest and Lung Exam Chest and lung exam reveals -quiet, even and easy respiratory effort with no use of accessory muscles and on auscultation, normal breath sounds, no adventitious sounds and normal vocal resonance. Inspection Chest Wall - Normal. Back - normal.  Breast Note: breasts reasonably symmetric. no palpable mass in either breast. no nipple retraction or skin dimpling. moderately ptotic. small hematoma at biopsy site. no LAD.   Cardiovascular Cardiovascular examination reveals -normal heart sounds, regular rate and rhythm with no murmurs and normal pedal pulses bilaterally.  Abdomen Inspection Inspection of the abdomen reveals - No Hernias. Palpation/Percussion Palpation and Percussion of the abdomen reveal - Soft, Non Tender, No Rebound tenderness, No Rigidity (guarding) and No  hepatosplenomegaly. Auscultation Auscultation of the abdomen reveals - Bowel sounds normal.  Neurologic Neurologic evaluation reveals -alert and oriented x 3 with no impairment of recent or remote memory. Mental Status-Normal.  Musculoskeletal Global Assessment -Note: no gross deformities.  Normal Exam - Left-Upper Extremity Strength Normal and Lower Extremity Strength Normal. Normal Exam - Right-Upper Extremity Strength Normal and Lower Extremity Strength Normal.  Lymphatic Head & Neck  General Head & Neck Lymphatics: Bilateral - Description - Normal. Axillary  General Axillary Region: Bilateral - Description - Normal. Tenderness - Non Tender. Femoral & Inguinal  Generalized Femoral & Inguinal Lymphatics: Bilateral - Description - No Generalized lymphadenopathy.    Assessment & Plan CARCINOMA OF UPPER-OUTER QUADRANT OF LEFT BREAST IN FEMALE, ESTROGEN RECEPTOR POSITIVE (C50.412) Impression: Pt has new diagnosis of stage IA left breast cancer. cT1cN0.  We will recommend MRI due to lobular phenotype. Barring unforeseen results, we will recommend lumpectomy and sentinel lymph node biopsy. This would be followed by oncotype Dx from surgical specimen as long as final pathology shows 1 cm tumor.  Additionally adjuvant XRT and antiestrogen therapy would also be recommended.  The surgical procedure was described to the patient. I discussed the incision type and location and that we would need radiology involved on with a wire or seed marker and/or sentinel node.  The risks and benefits of the procedure were described to the patient and she wishes to proceed.  We discussed the risks bleeding, infection, damage to other structures, need for further procedures/surgeries. We discussed the risk of seroma. The patient was advised if the area in the breast in cancer, we may need to go back to surgery for additional tissue to obtain negative margins or for a lymph node biopsy. The  patient was advised that these are the most common complications, but that others can occur as well. They were advised against taking aspirin or other anti-inflammatory agents/blood thinners the week before surgery.  60 min spent in evaluation, examination, counseling, and  coordination of care. >50% spent in counseling. Current Plans Pt Education - [facility] Education - Written Instructions given: discussed with patient and provided information. You are being scheduled for surgery- Our schedulers will call you.  You should hear from our office's scheduling department within 5 working days about the location, date, and time of surgery. We try to make accommodations for patient's preferences in scheduling surgery, but sometimes the OR schedule or the surgeon's schedule prevents [place] from making those accommodations.  If you have not heard from our office [phone]) in 5 working days, call the office and ask for your surgeon's nurse.  If you have other questions about your diagnosis, plan, or surgery, call the office and ask for your surgeon's nurse.    Signed by Stark Klein, MD

## 2017-04-16 NOTE — Anesthesia Preprocedure Evaluation (Signed)
Anesthesia Evaluation  Patient identified by MRN, date of birth, ID band Patient awake    Reviewed: Allergy & Precautions, NPO status , Patient's Chart, lab work & pertinent test results  Airway Mallampati: II  TM Distance: >3 FB Neck ROM: Full    Dental no notable dental hx.    Pulmonary neg pulmonary ROS,    Pulmonary exam normal breath sounds clear to auscultation       Cardiovascular hypertension, Pt. on medications Normal cardiovascular exam Rhythm:Regular Rate:Normal     Neuro/Psych negative neurological ROS  negative psych ROS   GI/Hepatic Neg liver ROS, GERD  Medicated and Controlled,  Endo/Other  negative endocrine ROS  Renal/GU negative Renal ROS  negative genitourinary   Musculoskeletal negative musculoskeletal ROS (+)   Abdominal   Peds negative pediatric ROS (+)  Hematology negative hematology ROS (+)   Anesthesia Other Findings   Reproductive/Obstetrics negative OB ROS                             Anesthesia Physical Anesthesia Plan  ASA: II  Anesthesia Plan: General   Post-op Pain Management:    Induction: Intravenous  Airway Management Planned: LMA  Additional Equipment:   Intra-op Plan:   Post-operative Plan: Extubation in OR  Informed Consent: I have reviewed the patients History and Physical, chart, labs and discussed the procedure including the risks, benefits and alternatives for the proposed anesthesia with the patient or authorized representative who has indicated his/her understanding and acceptance.   Dental advisory given  Plan Discussed with: CRNA  Anesthesia Plan Comments:         Anesthesia Quick Evaluation

## 2017-04-16 NOTE — Op Note (Signed)
Re-excisional left Breast Lumpectomy   Indications: This patient presents with history of positive superior margin and close lateral and anterior margin after partial mastectomy for left breast cancer   Pre-operative Diagnosis: left breast cancer   Post-operative Diagnosis: left breast cancer   Surgeon: Stark Klein   Assistants: n/a   Anesthesia: General anesthesia and Local anesthesia 0.5% bupivacaine, with epinephrine   ASA Class: 2   Procedure Details  The patient was seen in the Holding Room. The risks, benefits, complications, treatment options, and expected outcomes were discussed with the patient. The possibilities of reaction to medication, pulmonary aspiration, bleeding, infection, the need for additional procedures, failure to diagnose a condition, and creating a complication requiring transfusion or operation were discussed with the patient. The patient concurred with the proposed plan, giving informed consent. The site of surgery properly noted/marked. The patient was taken to Operating Room # 8, identified, and the procedure verified as re-excision of left breast cancer.  After induction of anesthesia, the left breast and chest were prepped and draped in standard fashion.  The lumpectomy was performed by reopening the prior incision. Seroma was aspirated. Additional margins were taken at superior, anterior, and lateral borders of the partial mastectomy cavity. Dissection was carried down to the pectoral fascia. Orientation sutures were placed in the specimens. Hemostasis was achieved with cautery. Additional marking clips were placed as needed at the edges of the cavity. The wound was irrigated and closed with a 3-0 Vicryl deep dermal interrupted and a 4-0 Monocryl subcuticular closure in layers.  Sterile dressings were applied. At the end of the operation, all sponge, instrument, and needle counts were correct.   Findings:  grossly clear surgical margins, posterior margin is  pectoralis.  Anterior margin is skin.   Estimated Blood Loss: Minimal   Drains: none   Specimens: additional superior, anterior and lateral margins.   Complications: None; patient tolerated the procedure well.   Disposition: PACU - hemodynamically stable.   Condition: stable

## 2017-04-16 NOTE — Anesthesia Postprocedure Evaluation (Signed)
Anesthesia Post Note  Patient: Shelly Beasley  Procedure(s) Performed: Procedure(s) (LRB): RE-EXCISION OF BREAST LUMPECTOMY (Left)  Patient location during evaluation: PACU Anesthesia Type: General Level of consciousness: awake and alert Pain management: pain level controlled Vital Signs Assessment: post-procedure vital signs reviewed and stable Respiratory status: spontaneous breathing, nonlabored ventilation, respiratory function stable and patient connected to nasal cannula oxygen Cardiovascular status: blood pressure returned to baseline and stable Postop Assessment: no signs of nausea or vomiting Anesthetic complications: no       Last Vitals:  Vitals:   04/16/17 1345 04/16/17 1357  BP: (!) 157/90 (!) 156/90  Pulse: 62 66  Resp: 14 16  Temp:  36.4 C    Last Pain:  Vitals:   04/16/17 1357  TempSrc:   PainSc: 0-No pain                 Montez Hageman

## 2017-04-16 NOTE — Interval H&P Note (Signed)
History and Physical Interval Note:  04/16/2017 10:27 AM  Shelly Beasley  has presented today for surgery, with the diagnosis of LEFT BREAST CANCER  The various methods of treatment have been discussed with the patient and family. After consideration of risks, benefits and other options for treatment, the patient has consented to  Procedure(s): RE-EXCISION OF BREAST LUMPECTOMY (Left) as a surgical intervention .  The patient's history has been reviewed, patient examined, no change in status, stable for surgery.  I have reviewed the patient's chart and labs.  Questions were answered to the patient's satisfaction.     Raffaele Derise

## 2017-04-16 NOTE — Transfer of Care (Signed)
Immediate Anesthesia Transfer of Care Note  Patient: Shelly Beasley  Procedure(s) Performed: Procedure(s): RE-EXCISION OF BREAST LUMPECTOMY (Left)  Patient Location: PACU  Anesthesia Type:General  Level of Consciousness: sedated  Airway & Oxygen Therapy: Patient Spontanous Breathing and Patient connected to face mask oxygen  Post-op Assessment: Report given to RN and Post -op Vital signs reviewed and stable  Post vital signs: Reviewed and stable  Last Vitals:  Vitals:   04/16/17 1046  BP: (!) 141/91  Pulse: 68  Resp: 18  Temp: 36.8 C    Last Pain:  Vitals:   04/16/17 1046  TempSrc: Oral         Complications: No apparent anesthesia complications

## 2017-04-16 NOTE — Discharge Instructions (Addendum)
[place] Office Phone Number [phone]  BREAST BIOPSY/ PARTIAL MASTECTOMY: POST OP INSTRUCTIONS  Always review your discharge instruction sheet given to you by the facility where your surgery was performed.  IF YOU HAVE DISABILITY OR FAMILY LEAVE FORMS, YOU MUST BRING THEM TO THE OFFICE FOR PROCESSING.  DO NOT GIVE THEM TO YOUR DOCTOR.  1. A prescription for pain medication may be given to you upon discharge.  Take your pain medication as prescribed, if needed.  If narcotic pain medicine is not needed, then you may take acetaminophen (Tylenol) or ibuprofen (Advil) as needed. 2. Take your usually prescribed medications unless otherwise directed 3. If you need a refill on your pain medication, please contact your pharmacy.  They will contact our office to request authorization.  Prescriptions will not be filled after 5pm or on week-ends. 4. You should eat very light the first 24 hours after surgery, such as soup, crackers, pudding, etc.  Resume your normal diet the day after surgery. 5. Most patients will experience some swelling and bruising in the breast.  Ice packs and a good support bra will help.  Swelling and bruising can take several days to resolve.  6. It is common to experience some constipation if taking pain medication after surgery.  Increasing fluid intake and taking a stool softener will usually help or prevent this problem from occurring.  A mild laxative (Milk of Magnesia or Miralax) should be taken according to package directions if there are no bowel movements after 48 hours. 7. Unless discharge instructions indicate otherwise, you may remove your bandages 48 hours after surgery, and you may shower at that time.  You may have steri-strips (small skin tapes) in place directly over the incision.  These strips should be left on the skin for 7-10 days.   Any sutures or staples will be removed at the office during your follow-up visit. 8. ACTIVITIES:  You may resume  regular daily activities (gradually increasing) beginning the next day.  Wearing a good support bra or sports bra (or the breast binder) minimizes pain and swelling.  You may have sexual intercourse when it is comfortable. a. You may drive when you no longer are taking prescription pain medication, you can comfortably wear a seatbelt, and you can safely maneuver your car and apply brakes. b. RETURN TO WORK:  __________1 week_______________ 9. You should see your doctor in the office for a follow-up appointment approximately two weeks after your surgery.  Your doctors nurse will typically make your follow-up appointment when she calls you with your pathology report.  Expect your pathology report 2-3 business days after your surgery.  You may call to check if you do not hear from [place] after three days.   WHEN TO CALL YOUR DOCTOR: 1. Fever over 101.0 2. Nausea and/or vomiting. 3. Extreme swelling or bruising. 4. Continued bleeding from incision. 5. Increased pain, redness, or drainage from the incision.  The clinic staff is available to answer your questions during regular business hours.  Please dont hesitate to call and ask to speak to one of the nurses for clinical concerns.  If you have a medical emergency, go to the nearest emergency room or call 911.  A surgeon from [facility] is always on call at the hospital.  For further questions, please visit [email or web address]     Post Anesthesia Home Care Instructions  Activity: Get plenty of rest for the remainder of the day. A responsible individual must stay with you for  following the procedure.  °For the next 24 hours, DO NOT: °-Drive a car °-Operate machinery °-Drink alcoholic beverages °-Take any medication unless instructed by your physician °-Make any legal decisions or sign important papers. ° °Meals: °Start with liquid foods such as gelatin or soup. Progress to regular foods as tolerated. Avoid greasy, spicy, heavy  foods. If nausea and/or vomiting occur, drink only clear liquids until the nausea and/or vomiting subsides. Call your physician if vomiting continues. ° °Special Instructions/Symptoms: °Your throat may feel dry or sore from the anesthesia or the breathing tube placed in your throat during surgery. If this causes discomfort, gargle with warm salt water. The discomfort should disappear within 24 hours. ° °If you had a scopolamine patch placed behind your ear for the management of post- operative nausea and/or vomiting: ° °1. The medication in the patch is effective for 72 hours, after which it should be removed.  Wrap patch in a tissue and discard in the trash. Wash hands thoroughly with soap and water. °2. You may remove the patch earlier than 72 hours if you experience unpleasant side effects which may include dry mouth, dizziness or visual disturbances. °3. Avoid touching the patch. Wash your hands with soap and water after contact with the patch. °  ° ° °Post Anesthesia Home Care Instructions ° °Activity: °Get plenty of rest for the remainder of the day. A responsible individual must stay with you for 24 hours following the procedure.  °For the next 24 hours, DO NOT: °-Drive a car °-Operate machinery °-Drink alcoholic beverages °-Take any medication unless instructed by your physician °-Make any legal decisions or sign important papers. ° °Meals: °Start with liquid foods such as gelatin or soup. Progress to regular foods as tolerated. Avoid greasy, spicy, heavy foods. If nausea and/or vomiting occur, drink only clear liquids until the nausea and/or vomiting subsides. Call your physician if vomiting continues. ° °Special Instructions/Symptoms: °Your throat may feel dry or sore from the anesthesia or the breathing tube placed in your throat during surgery. If this causes discomfort, gargle with warm salt water. The discomfort should disappear within 24 hours. ° °If you had a scopolamine patch placed behind your ear  for the management of post- operative nausea and/or vomiting: ° °1. The medication in the patch is effective for 72 hours, after which it should be removed.  Wrap patch in a tissue and discard in the trash. Wash hands thoroughly with soap and water. °2. You may remove the patch earlier than 72 hours if you experience unpleasant side effects which may include dry mouth, dizziness or visual disturbances. °3. Avoid touching the patch. Wash your hands with soap and water after contact with the patch. °  ° ° °

## 2017-04-16 NOTE — Anesthesia Procedure Notes (Signed)
Procedure Name: LMA Insertion Date/Time: 04/16/2017 11:47 AM Performed by: Maryella Shivers Pre-anesthesia Checklist: Patient identified, Emergency Drugs available, Suction available and Patient being monitored Patient Re-evaluated:Patient Re-evaluated prior to inductionOxygen Delivery Method: Circle system utilized Preoxygenation: Pre-oxygenation with 100% oxygen Intubation Type: IV induction Ventilation: Mask ventilation without difficulty LMA: LMA inserted LMA Size: 4.0 Number of attempts: 1 Airway Equipment and Method: Bite block Placement Confirmation: positive ETCO2 Tube secured with: Tape Dental Injury: Teeth and Oropharynx as per pre-operative assessment

## 2017-04-17 ENCOUNTER — Telehealth: Payer: Self-pay | Admitting: *Deleted

## 2017-04-17 ENCOUNTER — Encounter (HOSPITAL_COMMUNITY): Payer: Self-pay

## 2017-04-17 ENCOUNTER — Encounter (HOSPITAL_BASED_OUTPATIENT_CLINIC_OR_DEPARTMENT_OTHER): Payer: Self-pay | Admitting: General Surgery

## 2017-04-17 NOTE — Telephone Encounter (Signed)
Discussed with pt oncotype score of 12 and informed she does not need chemotherapy. Discussed her next step is xrt and will receive a call for an appt. Referral placed for Dr. Sondra Come. Pt will see Dr. Jana Hakim after xrt. Denies questions or needs at this time.

## 2017-04-17 NOTE — Telephone Encounter (Signed)
Received oncotype score of 12/8%. Physician team notified.  Left vm for pt to return call to discuss the results. Contact information provided

## 2017-04-20 ENCOUNTER — Encounter: Payer: Self-pay | Admitting: Radiation Oncology

## 2017-04-20 NOTE — Progress Notes (Signed)
Please let patient know margins are negative!

## 2017-04-27 ENCOUNTER — Ambulatory Visit: Payer: Self-pay | Admitting: Oncology

## 2017-04-29 NOTE — Progress Notes (Signed)
Location of Breast Cancer: Clinical stage IA (cT1bN0) invasive lobular carcinoma of the left breast   Histology per Pathology Report:   04/16/17 Diagnosis 1. Breast, excision, Left Superior Margin - PREVIOUS BIOPSY SITE AND FIBROCYSTIC CHANGES. - NO RESIDUAL CARCINOMA IDENTIFIED. - FINAL SUPERIOR MARGIN CLEAR. 2. Breast, excision, Left Anterior Margin - FOCAL LOBULAR NEOPLASIA (ATYPICAL LOBULAR HYPERPLASIA). - PREVIOUS BIOPSY SITE. - NO RESIDUAL CARCINOMA IDENTIFIED. - FINAL ANTERIOR MARGIN CLEAR. 3. Breast, excision, Left Lateral Margin - PREVIOUS BIOPSY SITE AND FIBROCYSTIC CHANGES. - NO RESIDUAL CARCINOMA IDENTIFIED. - FINAL LATERAL MARGIN CLEAR.  04/02/17 Diagnosis 1. Breast, lumpectomy, Left - INVASIVE AND IN SITU LOBULAR CARCINOMA, 1.2 CM. - INVASIVE CARCINOMA LESS THAN 0.1 CM FROM LATERAL MARGIN AND 0.1 CM FROM ANTERIOR MARGIN. - CARCINOMA IN SITU FOCALLY 0.1 CM FROM POSTERIOR MARGIN. - FIBROCYSTIC CHANGES WITH CALCIFICATIONS. 2. Breast, excision, Left superior margin - MULTIFOCAL INVASIVE AND IN SITU LOBULAR CARCINOMA, 0.5 AND 0.4 CM. - INVASIVE CARCINOMA BROADLY INVOLVES FINAL SUPERIOR MARGIN. - FIBROCYSTIC CHANGES. 3. Lymph node, sentinel, biopsy, Left axillary #1 - ONE BENIGN LYMPH NODE (0/1). 4. Lymph node, sentinel, biopsy, Left axillary #2 - ONE BENIGN LYMPH NODE (0/1). 5. Lymph node, sentinel, biopsy, Left axillary #3 - ONE BENIGN LYMPH NODE (0/1).  01/26/17 Diagnosis Breast, left, needle core biopsy, upper outer quadrant 1:30 o'clock - INVASIVE MAMMARY CARCINOMA.  Receptor Status: ER(100%), PR (100%), Her2-neu (negative), Ki-(70%)  Did patient present with symptoms (if so, please note symptoms) or was this found on screening mammography?: screening mammography  Past/Anticipated interventions by surgeon, if any:   04/16/17 -Procedure: RE-EXCISION OF BREAST LUMPECTOMY;  Surgeon: Stark Klein, MD  04/02/17 - Procedure: LEFT BREAST LUMPECTOMY WITH RADIOACTIVE  SEED AND SENTINEL LYMPH NODE BIOPSY;  Surgeon: Stark Klein, MD  Past/Anticipated interventions by medical oncology, if any: 5 years of anti-estrogens. Oncotype score of 12.  Lymphedema issues, if any: no}   Pain issues, if any:  no   OB/GYN history: Patient is postmenopausal. Menarche age 5, first live birth age 20, the patient is [place] P3. She stopped having periods approximately 2013. She did not take hormone replacement. She used oral contraceptives for approximately 2 years remotely, with no complications.  SAFETY ISSUES:  Prior radiation? no  Pacemaker/ICD? no  Possible current pregnancy?no  Is the patient on methotrexate? no  Current Complaints / other details:  Patient is here with her husband.  BP 131/88 (BP Location: Right Arm, Patient Position: Sitting)   Pulse 69   Temp 98.2 F (36.8 C) (Oral)   Ht 5' 5"  (1.651 m)   Wt 185 lb (83.9 kg)   SpO2 99%   BMI 30.79 kg/m    Wt Readings from Last 3 Encounters:  05/04/17 185 lb (83.9 kg)  04/16/17 184 lb (83.5 kg)  04/02/17 185 lb (83.9 kg)      Jacqulyn Liner, RN 04/29/2017,11:03 AM

## 2017-05-04 ENCOUNTER — Encounter: Payer: Self-pay | Admitting: Radiation Oncology

## 2017-05-04 ENCOUNTER — Ambulatory Visit
Admission: RE | Admit: 2017-05-04 | Discharge: 2017-05-04 | Disposition: A | Discharge status: Home / Self Care | Payer: BC Managed Care – PPO | Source: Ambulatory Visit | Attending: Radiation Oncology | Admitting: Radiation Oncology

## 2017-05-04 DIAGNOSIS — C50412 Malignant neoplasm of upper-outer quadrant of left female breast: Secondary | ICD-10-CM | POA: Diagnosis not present

## 2017-05-04 DIAGNOSIS — Z79899 Other long term (current) drug therapy: Secondary | ICD-10-CM | POA: Insufficient documentation

## 2017-05-04 DIAGNOSIS — Z170 Estrogen receptor positive status [ER+]: Secondary | ICD-10-CM | POA: Insufficient documentation

## 2017-05-04 DIAGNOSIS — Z510 Encounter for antineoplastic radiation therapy: Secondary | ICD-10-CM | POA: Diagnosis not present

## 2017-05-04 DIAGNOSIS — R200 Anesthesia of skin: Secondary | ICD-10-CM | POA: Diagnosis not present

## 2017-05-04 DIAGNOSIS — Z885 Allergy status to narcotic agent status: Secondary | ICD-10-CM | POA: Insufficient documentation

## 2017-05-04 NOTE — Progress Notes (Signed)
Radiation Oncology         [phone] ________________________________  Name: Shelly Beasley MRN: [id number]  Date: 05/04/2017  DOB: Jan 14, 1961  Re-Evaluation Visit Note  CC: Harlan Stains, MD  Magrinat, [name], MD    ICD-9-CM ICD-10-CM   1. Malignant neoplasm of upper-outer quadrant of left breast in female, estrogen receptor positive ([place]) 174.4 C50.412    V86.0 Z17.0     Diagnosis: Clinical stage IA (cT1bN0) invasive lobular carcinoma of the left breast (ER+,PR+,HER2-)  Narrative:  The patient returns today for re-evaluation since breast clinic on 02/04/17.  Patient underwent left breast lumpectomy on 04/02/17 by Dr. Barry Dienes. This showed invasive and in situ lobular carcinoma spanning 1.2 cm. There was invasive carcinoma less than 0.1 cm from the lateral and anterior margin. There was carcinoma in situ focally 0.1 cm from the posterior margin. Excision of the left superior margin showed multifocal invasive and in situ lobular carcinoma. Sentinel lymph node biopsy showed 3 benign lymph nodes (0/3). Patient underwent re-excision on 04/16/17. This showed no residual carcinoma in the left superior margin, left anterior margin, and left lateral margin on re-excision.      Patient denies lymphedema or pain at this time. She reports mild numbness under her left arm.                  ALLERGIES:  is allergic to codeine.  Meds: Current Outpatient Prescriptions  Medication Sig Dispense Refill  . olmesartan-hydrochlorothiazide (BENICAR HCT) 40-12.5 MG per tablet Take 1 tablet by mouth daily.    Marland Kitchen senna (SENOKOT) 8.6 MG tablet Take 1 tablet by mouth as needed for constipation.    Marland Kitchen HYDROcodone-acetaminophen (NORCO) 5-325 MG tablet Take 1-2 tablets by mouth every 6 (six) hours as needed for moderate pain or severe pain. (Patient not taking: Reported on 05/04/2017) 20 tablet 0  . omeprazole (PRILOSEC) 40 MG capsule Take 40 mg by mouth daily.      No current facility-administered medications  for this encounter.     Physical Findings: The patient is in no acute distress. Patient is alert and oriented.  height is _0  (1.651 m) and weight is 185 lb (83.9 kg). Her oral temperature is 98.2 F (36.8 C). Her blood pressure is 131/88 and her pulse is 69. Her oxygen saturation is 99%. .  No significant changes. Lungs are clear to auscultation bilaterally. Heart has regular rate and rhythm. No palpable cervical, supraclavicular, or axillary adenopathy. Abdomen soft, non-tender, normal bowel sounds. Right breast no palpable mass or nipple discharge. Left breast patient has a scar in UOQ that is healing well without signs of drainage or infection. No other scars appreciated in the breast.  Lab Findings: Lab Results  Component Value Date   WBC 4.2 02/04/2017   HGB 13.5 02/04/2017   HCT 41.0 02/04/2017   MCV 84.2 02/04/2017   PLT 244 02/04/2017    Radiographic Findings: No results found.  Impression:  Clinical stage IA (cT1bN0) invasive lobular carcinoma of the left breast (ER+,PR+,HER2-). The patient would be a good candidate for breast conservation therapy with adjuvant radiation therapy directed to the left breast. We discussed the course of treatment, side effects, and potential toxicities with the patient and her husband. She appears to understand and wishes to proceed with treatment. A consent form was signed and a copy was placed in the patient's chart.   Plan: Patient will be scheduled for CT simulation and treatment planning in 2 weeks. Anticipate 5.5 weeks of  treatment to begin the following week. No boost will be performed in light of the re-excision showing no residual cancer.  ____________________________________    This document serves as a record of services personally performed by [name], MD. It was created on his behalf by Bethann Humble, a trained medical scribe. The creation of this record is based on the scribe's personal observations and the provider's statements  to them. This document has been checked and approved by the attending provider.

## 2017-05-04 NOTE — Progress Notes (Signed)
Please see the Nurse Progress Note in the MD Initial Consult Encounter for this patient. 

## 2017-05-18 ENCOUNTER — Ambulatory Visit
Admission: RE | Admit: 2017-05-18 | Discharge: 2017-05-18 | Disposition: A | Discharge status: Home / Self Care | Payer: BC Managed Care – PPO | Source: Ambulatory Visit | Attending: Radiation Oncology | Admitting: Radiation Oncology

## 2017-05-18 DIAGNOSIS — C50412 Malignant neoplasm of upper-outer quadrant of left female breast: Secondary | ICD-10-CM

## 2017-05-18 DIAGNOSIS — Z510 Encounter for antineoplastic radiation therapy: Secondary | ICD-10-CM | POA: Diagnosis not present

## 2017-05-18 DIAGNOSIS — Z170 Estrogen receptor positive status [ER+]: Principal | ICD-10-CM

## 2017-05-18 NOTE — Progress Notes (Addendum)
  Radiation Oncology         [phone] ________________________________  Name: Shelly Beasley MRN: [id number]  Date: 05/18/2017  DOB: Jun 28, 1961  SIMULATION AND TREATMENT PLANNING NOTE    ICD-9-CM ICD-10-CM   1. Malignant neoplasm of upper-outer quadrant of left breast in female, estrogen receptor positive ([place]) 174.4 C50.412    V86.0 Z17.0     DIAGNOSIS:  Stage IA (mpT1c, pN0, cM0) invasive lobular carcinoma of the left breast, ER/PR positive, HER2 negative, Grade 2  NARRATIVE:  The patient was brought to the [facility].  Identity was confirmed.  All relevant records and images related to the planned course of therapy were reviewed.  The patient freely provided informed written consent to proceed with treatment after reviewing the details related to the planned course of therapy. The consent form was witnessed and verified by the simulation staff.  Then, the patient was set-up in a stable reproducible  supine position for radiation therapy.  CT images were obtained.  Surface markings were placed.  The CT images were loaded into the planning software.  Then the target and avoidance structures were contoured.  Treatment planning then occurred.  The radiation prescription was entered and confirmed.  Then, I designed and supervised the construction of a total of 3 medically necessary complex treatment devices.  I have requested : 3D Simulation  I have requested a DVH of the following structures: heart, lungs, lumpectomy cavity.  I have ordered: dose calc.  PLAN:  The patient will receive 50.4 Gy in 28 fractions. No boost is planned in light of re-excision showing no residual cancer Unless adequate margins could not be obtained on the initial tangential fields covering the lumpectomy cavity.  -----------------------------------  Blair Promise, PhD, MD  This document serves as a record of services personally performed by [name], MD. It was created on his behalf by Darcus Austin, a trained medical scribe. The creation of this record is based on the scribe's personal observations and the provider's statements to them. This document has been checked and approved by the attending provider.

## 2017-05-20 DIAGNOSIS — Z510 Encounter for antineoplastic radiation therapy: Secondary | ICD-10-CM | POA: Diagnosis not present

## 2017-05-21 ENCOUNTER — Telehealth: Payer: Self-pay | Admitting: Oncology

## 2017-05-21 NOTE — Telephone Encounter (Signed)
sw pt to confirm 7/19 appt at 345 pm per sch msg. GM on PAl 7/20

## 2017-05-25 ENCOUNTER — Ambulatory Visit
Admission: RE | Admit: 2017-05-25 | Discharge: 2017-05-25 | Disposition: A | Discharge status: Home / Self Care | Payer: BC Managed Care – PPO | Source: Ambulatory Visit | Attending: Radiation Oncology | Admitting: Radiation Oncology

## 2017-05-25 DIAGNOSIS — Z510 Encounter for antineoplastic radiation therapy: Secondary | ICD-10-CM | POA: Diagnosis not present

## 2017-05-25 DIAGNOSIS — Z170 Estrogen receptor positive status [ER+]: Principal | ICD-10-CM

## 2017-05-25 DIAGNOSIS — C50412 Malignant neoplasm of upper-outer quadrant of left female breast: Secondary | ICD-10-CM

## 2017-05-25 NOTE — Progress Notes (Signed)
  Radiation Oncology         [phone] ________________________________  Name: Shelly Beasley MRN: [id number]  Date: 05/25/2017  DOB: 10/20/1961  Simulation Verification Note    ICD-10-CM   1. Malignant neoplasm of upper-outer quadrant of left breast in female, estrogen receptor positive ([place]) C50.412    Z17.0     Status: outpatient  NARRATIVE: The patient was brought to the treatment unit and placed in the planned treatment position. The clinical setup was verified. Then port films were obtained and uploaded to the radiation oncology medical record software.  The treatment beams were carefully compared against the planned radiation fields. The position location and shape of the radiation fields was reviewed. They targeted volume of tissue appears to be appropriately covered by the radiation beams. Organs at risk appear to be excluded as planned.  Based on my personal review, I approved the simulation verification. The patient's treatment will proceed as planned.  -----------------------------------  Blair Promise, PhD, MD

## 2017-05-26 ENCOUNTER — Ambulatory Visit
Admission: RE | Admit: 2017-05-26 | Discharge: 2017-05-26 | Disposition: A | Discharge status: Home / Self Care | Payer: BC Managed Care – PPO | Source: Ambulatory Visit | Attending: Radiation Oncology | Admitting: Radiation Oncology

## 2017-05-26 ENCOUNTER — Ambulatory Visit: Payer: BC Managed Care – PPO | Admitting: Radiation Oncology

## 2017-05-26 DIAGNOSIS — Z510 Encounter for antineoplastic radiation therapy: Secondary | ICD-10-CM | POA: Diagnosis not present

## 2017-05-27 ENCOUNTER — Ambulatory Visit
Admission: RE | Admit: 2017-05-27 | Discharge: 2017-05-27 | Disposition: A | Discharge status: Home / Self Care | Payer: BC Managed Care – PPO | Source: Ambulatory Visit | Attending: Radiation Oncology | Admitting: Radiation Oncology

## 2017-05-27 DIAGNOSIS — C50412 Malignant neoplasm of upper-outer quadrant of left female breast: Secondary | ICD-10-CM

## 2017-05-27 DIAGNOSIS — Z170 Estrogen receptor positive status [ER+]: Principal | ICD-10-CM

## 2017-05-27 DIAGNOSIS — Z510 Encounter for antineoplastic radiation therapy: Secondary | ICD-10-CM | POA: Diagnosis not present

## 2017-05-27 MED ORDER — RADIAPLEXRX EX GEL
Freq: Once | CUTANEOUS | Status: AC
  Administered 2017-05-27: 10:00:00 via TOPICAL

## 2017-05-27 MED ORDER — ALRA NON-METALLIC DEODORANT (RAD-ONC)
1.0000 "application " | Freq: Once | TOPICAL | Status: AC
  Administered 2017-05-27: 1 via TOPICAL

## 2017-05-27 NOTE — Progress Notes (Signed)
Pt here for patient teaching.  Pt given Radiation and You booklet, skin care instructions, Alra deodorant and Radiaplex gel.  Reviewed areas of pertinence such as fatigue, skin changes, breast tenderness and breast swelling . Pt able to give teach back of to pat skin and use unscented/gentle soap,apply Radiaplex bid and avoid applying anything to skin within 4 hours of treatment. Pt demonstrated understanding and verbalizes understanding of information given and will contact nursing with any questions or concerns.          

## 2017-05-28 ENCOUNTER — Ambulatory Visit
Admission: RE | Admit: 2017-05-28 | Discharge: 2017-05-28 | Disposition: A | Discharge status: Home / Self Care | Payer: BC Managed Care – PPO | Source: Ambulatory Visit | Attending: Radiation Oncology | Admitting: Radiation Oncology

## 2017-05-28 DIAGNOSIS — Z510 Encounter for antineoplastic radiation therapy: Secondary | ICD-10-CM | POA: Diagnosis not present

## 2017-05-29 ENCOUNTER — Ambulatory Visit
Admission: RE | Admit: 2017-05-29 | Discharge: 2017-05-29 | Disposition: A | Discharge status: Home / Self Care | Payer: BC Managed Care – PPO | Source: Ambulatory Visit | Attending: Radiation Oncology | Admitting: Radiation Oncology

## 2017-05-29 DIAGNOSIS — Z510 Encounter for antineoplastic radiation therapy: Secondary | ICD-10-CM | POA: Diagnosis not present

## 2017-06-01 ENCOUNTER — Ambulatory Visit
Admission: RE | Admit: 2017-06-01 | Discharge: 2017-06-01 | Disposition: A | Discharge status: Home / Self Care | Payer: BC Managed Care – PPO | Source: Ambulatory Visit | Attending: Radiation Oncology | Admitting: Radiation Oncology

## 2017-06-01 DIAGNOSIS — Z510 Encounter for antineoplastic radiation therapy: Secondary | ICD-10-CM | POA: Diagnosis not present

## 2017-06-02 ENCOUNTER — Ambulatory Visit
Admission: RE | Admit: 2017-06-02 | Discharge: 2017-06-02 | Disposition: A | Discharge status: Home / Self Care | Payer: BC Managed Care – PPO | Source: Ambulatory Visit | Attending: Radiation Oncology | Admitting: Radiation Oncology

## 2017-06-02 DIAGNOSIS — Z510 Encounter for antineoplastic radiation therapy: Secondary | ICD-10-CM | POA: Diagnosis not present

## 2017-06-03 ENCOUNTER — Ambulatory Visit
Admission: RE | Admit: 2017-06-03 | Discharge: 2017-06-03 | Disposition: A | Discharge status: Home / Self Care | Payer: BC Managed Care – PPO | Source: Ambulatory Visit | Attending: Radiation Oncology | Admitting: Radiation Oncology

## 2017-06-03 DIAGNOSIS — Z510 Encounter for antineoplastic radiation therapy: Secondary | ICD-10-CM | POA: Diagnosis not present

## 2017-06-04 ENCOUNTER — Ambulatory Visit
Admission: RE | Admit: 2017-06-04 | Discharge: 2017-06-04 | Disposition: A | Discharge status: Home / Self Care | Payer: BC Managed Care – PPO | Source: Ambulatory Visit | Attending: Radiation Oncology | Admitting: Radiation Oncology

## 2017-06-04 DIAGNOSIS — Z510 Encounter for antineoplastic radiation therapy: Secondary | ICD-10-CM | POA: Diagnosis not present

## 2017-06-05 ENCOUNTER — Ambulatory Visit
Admission: RE | Admit: 2017-06-05 | Discharge: 2017-06-05 | Disposition: A | Discharge status: Home / Self Care | Payer: BC Managed Care – PPO | Source: Ambulatory Visit | Attending: Radiation Oncology | Admitting: Radiation Oncology

## 2017-06-05 DIAGNOSIS — Z510 Encounter for antineoplastic radiation therapy: Secondary | ICD-10-CM | POA: Diagnosis not present

## 2017-06-08 ENCOUNTER — Ambulatory Visit
Admission: RE | Admit: 2017-06-08 | Discharge: 2017-06-08 | Disposition: A | Discharge status: Home / Self Care | Payer: BC Managed Care – PPO | Source: Ambulatory Visit | Attending: Radiation Oncology | Admitting: Radiation Oncology

## 2017-06-08 DIAGNOSIS — Z510 Encounter for antineoplastic radiation therapy: Secondary | ICD-10-CM | POA: Diagnosis not present

## 2017-06-09 ENCOUNTER — Ambulatory Visit
Admission: RE | Admit: 2017-06-09 | Discharge: 2017-06-09 | Disposition: A | Discharge status: Home / Self Care | Payer: BC Managed Care – PPO | Source: Ambulatory Visit | Attending: Radiation Oncology | Admitting: Radiation Oncology

## 2017-06-09 DIAGNOSIS — Z510 Encounter for antineoplastic radiation therapy: Secondary | ICD-10-CM | POA: Diagnosis not present

## 2017-06-10 ENCOUNTER — Ambulatory Visit
Admission: RE | Admit: 2017-06-10 | Discharge: 2017-06-10 | Disposition: A | Discharge status: Home / Self Care | Payer: BC Managed Care – PPO | Source: Ambulatory Visit | Attending: Radiation Oncology | Admitting: Radiation Oncology

## 2017-06-10 DIAGNOSIS — Z510 Encounter for antineoplastic radiation therapy: Secondary | ICD-10-CM | POA: Diagnosis not present

## 2017-06-11 ENCOUNTER — Ambulatory Visit
Admission: RE | Admit: 2017-06-11 | Discharge: 2017-06-11 | Disposition: A | Discharge status: Home / Self Care | Payer: BC Managed Care – PPO | Source: Ambulatory Visit | Attending: Radiation Oncology | Admitting: Radiation Oncology

## 2017-06-11 DIAGNOSIS — Z510 Encounter for antineoplastic radiation therapy: Secondary | ICD-10-CM | POA: Diagnosis not present

## 2017-06-12 ENCOUNTER — Ambulatory Visit
Admission: RE | Admit: 2017-06-12 | Discharge: 2017-06-12 | Disposition: A | Discharge status: Home / Self Care | Payer: BC Managed Care – PPO | Source: Ambulatory Visit | Attending: Radiation Oncology | Admitting: Radiation Oncology

## 2017-06-12 DIAGNOSIS — Z510 Encounter for antineoplastic radiation therapy: Secondary | ICD-10-CM | POA: Diagnosis not present

## 2017-06-15 ENCOUNTER — Ambulatory Visit
Admission: RE | Admit: 2017-06-15 | Discharge: 2017-06-15 | Disposition: A | Discharge status: Home / Self Care | Payer: BC Managed Care – PPO | Source: Ambulatory Visit | Attending: Radiation Oncology | Admitting: Radiation Oncology

## 2017-06-15 DIAGNOSIS — Z510 Encounter for antineoplastic radiation therapy: Secondary | ICD-10-CM | POA: Diagnosis not present

## 2017-06-16 ENCOUNTER — Ambulatory Visit
Admission: RE | Admit: 2017-06-16 | Discharge: 2017-06-16 | Disposition: A | Discharge status: Home / Self Care | Payer: BC Managed Care – PPO | Source: Ambulatory Visit | Attending: Radiation Oncology | Admitting: Radiation Oncology

## 2017-06-16 DIAGNOSIS — C50412 Malignant neoplasm of upper-outer quadrant of left female breast: Secondary | ICD-10-CM

## 2017-06-16 DIAGNOSIS — Z170 Estrogen receptor positive status [ER+]: Principal | ICD-10-CM

## 2017-06-16 DIAGNOSIS — Z510 Encounter for antineoplastic radiation therapy: Secondary | ICD-10-CM | POA: Diagnosis not present

## 2017-06-16 MED ORDER — RADIAPLEXRX EX GEL
Freq: Once | CUTANEOUS | Status: AC
  Administered 2017-06-16: 09:00:00 via TOPICAL

## 2017-06-18 ENCOUNTER — Ambulatory Visit
Admission: RE | Admit: 2017-06-18 | Discharge: 2017-06-18 | Disposition: A | Discharge status: Home / Self Care | Payer: BC Managed Care – PPO | Source: Ambulatory Visit | Attending: Radiation Oncology | Admitting: Radiation Oncology

## 2017-06-18 DIAGNOSIS — Z510 Encounter for antineoplastic radiation therapy: Secondary | ICD-10-CM | POA: Diagnosis not present

## 2017-06-19 ENCOUNTER — Ambulatory Visit
Admission: RE | Admit: 2017-06-19 | Discharge: 2017-06-19 | Disposition: A | Discharge status: Home / Self Care | Payer: BC Managed Care – PPO | Source: Ambulatory Visit | Attending: Radiation Oncology | Admitting: Radiation Oncology

## 2017-06-19 DIAGNOSIS — Z510 Encounter for antineoplastic radiation therapy: Secondary | ICD-10-CM | POA: Diagnosis not present

## 2017-06-22 ENCOUNTER — Ambulatory Visit
Admission: RE | Admit: 2017-06-22 | Discharge: 2017-06-22 | Disposition: A | Discharge status: Home / Self Care | Payer: BC Managed Care – PPO | Source: Ambulatory Visit | Attending: Radiation Oncology | Admitting: Radiation Oncology

## 2017-06-22 DIAGNOSIS — Z510 Encounter for antineoplastic radiation therapy: Secondary | ICD-10-CM | POA: Diagnosis not present

## 2017-06-23 ENCOUNTER — Ambulatory Visit
Admission: RE | Admit: 2017-06-23 | Discharge: 2017-06-23 | Disposition: A | Discharge status: Home / Self Care | Payer: BC Managed Care – PPO | Source: Ambulatory Visit | Attending: Radiation Oncology | Admitting: Radiation Oncology

## 2017-06-23 DIAGNOSIS — Z510 Encounter for antineoplastic radiation therapy: Secondary | ICD-10-CM | POA: Diagnosis not present

## 2017-06-24 ENCOUNTER — Ambulatory Visit
Admission: RE | Admit: 2017-06-24 | Discharge: 2017-06-24 | Disposition: A | Discharge status: Home / Self Care | Payer: BC Managed Care – PPO | Source: Ambulatory Visit | Attending: Radiation Oncology | Admitting: Radiation Oncology

## 2017-06-24 DIAGNOSIS — Z510 Encounter for antineoplastic radiation therapy: Secondary | ICD-10-CM | POA: Diagnosis not present

## 2017-06-25 ENCOUNTER — Ambulatory Visit
Admission: RE | Admit: 2017-06-25 | Discharge: 2017-06-25 | Disposition: A | Discharge status: Home / Self Care | Payer: BC Managed Care – PPO | Source: Ambulatory Visit | Attending: Radiation Oncology | Admitting: Radiation Oncology

## 2017-06-25 DIAGNOSIS — Z510 Encounter for antineoplastic radiation therapy: Secondary | ICD-10-CM | POA: Diagnosis not present

## 2017-06-26 ENCOUNTER — Ambulatory Visit
Admission: RE | Admit: 2017-06-26 | Discharge: 2017-06-26 | Disposition: A | Discharge status: Home / Self Care | Payer: BC Managed Care – PPO | Source: Ambulatory Visit | Attending: Radiation Oncology | Admitting: Radiation Oncology

## 2017-06-26 DIAGNOSIS — Z510 Encounter for antineoplastic radiation therapy: Secondary | ICD-10-CM | POA: Diagnosis not present

## 2017-06-29 ENCOUNTER — Ambulatory Visit
Admission: RE | Admit: 2017-06-29 | Discharge: 2017-06-29 | Disposition: A | Discharge status: Home / Self Care | Payer: BC Managed Care – PPO | Source: Ambulatory Visit | Attending: Radiation Oncology | Admitting: Radiation Oncology

## 2017-06-29 DIAGNOSIS — Z510 Encounter for antineoplastic radiation therapy: Secondary | ICD-10-CM | POA: Diagnosis not present

## 2017-06-30 ENCOUNTER — Ambulatory Visit
Admission: RE | Admit: 2017-06-30 | Discharge: 2017-06-30 | Disposition: A | Discharge status: Home / Self Care | Payer: BC Managed Care – PPO | Source: Ambulatory Visit | Attending: Radiation Oncology | Admitting: Radiation Oncology

## 2017-06-30 DIAGNOSIS — Z170 Estrogen receptor positive status [ER+]: Principal | ICD-10-CM

## 2017-06-30 DIAGNOSIS — Z510 Encounter for antineoplastic radiation therapy: Secondary | ICD-10-CM | POA: Diagnosis not present

## 2017-06-30 DIAGNOSIS — C50412 Malignant neoplasm of upper-outer quadrant of left female breast: Secondary | ICD-10-CM

## 2017-06-30 MED ORDER — RADIAPLEXRX EX GEL
Freq: Once | CUTANEOUS | Status: AC
  Administered 2017-06-30: 16:00:00 via TOPICAL

## 2017-07-01 ENCOUNTER — Ambulatory Visit
Admission: RE | Admit: 2017-07-01 | Discharge: 2017-07-01 | Disposition: A | Discharge status: Home / Self Care | Payer: BC Managed Care – PPO | Source: Ambulatory Visit | Attending: Radiation Oncology | Admitting: Radiation Oncology

## 2017-07-01 DIAGNOSIS — Z510 Encounter for antineoplastic radiation therapy: Secondary | ICD-10-CM | POA: Diagnosis not present

## 2017-07-02 ENCOUNTER — Ambulatory Visit
Admission: RE | Admit: 2017-07-02 | Discharge: 2017-07-02 | Disposition: A | Discharge status: Home / Self Care | Payer: BC Managed Care – PPO | Source: Ambulatory Visit | Attending: Radiation Oncology | Admitting: Radiation Oncology

## 2017-07-02 ENCOUNTER — Ambulatory Visit: Payer: BC Managed Care – PPO

## 2017-07-02 ENCOUNTER — Ambulatory Visit (HOSPITAL_BASED_OUTPATIENT_CLINIC_OR_DEPARTMENT_OTHER): Payer: BC Managed Care – PPO | Admitting: Oncology

## 2017-07-02 VITALS — BP 152/78 | HR 79 | Temp 97.9°F | Resp 18 | Ht 65.0 in | Wt 185.8 lb

## 2017-07-02 DIAGNOSIS — Z510 Encounter for antineoplastic radiation therapy: Secondary | ICD-10-CM | POA: Diagnosis not present

## 2017-07-02 DIAGNOSIS — Z170 Estrogen receptor positive status [ER+]: Secondary | ICD-10-CM | POA: Diagnosis not present

## 2017-07-02 DIAGNOSIS — C50412 Malignant neoplasm of upper-outer quadrant of left female breast: Secondary | ICD-10-CM | POA: Diagnosis not present

## 2017-07-02 MED ORDER — TAMOXIFEN CITRATE 20 MG PO TABS: 20.0000 mg | ORAL_TABLET | Freq: Every day | ORAL | 12 refills | Status: DC

## 2017-07-02 NOTE — Progress Notes (Signed)
[facility]  [phone] [phone]     ID: RYA RAUSCH DOB: 1960/12/21  MR#: [id number]  GYJ#:[id number]  Patient Care Team: Harlan Stains, MD as PCP - General (Family Medicine) Stark Klein, MD as Consulting Physician (General Surgery) Mabelle Mungin, [name], MD as Consulting Physician (Oncology) [name], MD as Consulting Physician (Radiation Oncology) [name], MD as Consulting Physician (Orthopedic Surgery) Chauncey Cruel, MD OTHER MD:  CHIEF COMPLAINT: Estrogen receptor positive breast cancer  CURRENT TREATMENT: Tamoxifen  BREAST CANCER HISTORY: From the original intake note:  Ahriyah had routine bilateral screening mammography with tomography at [facility] 01/16/2017. This showed a possible mass in the left breast. On 01/23/2017 she underwent left diagnostic mammography with tomography and ultrasonography. The breast density was category C. In the lateral posterior breast, there was an irregular 1 cm mass which by ultrasound was located at 8 cm from the nipple at the 1:30 o'clock position. This was irregular, hypoechoic, and measured 1.0 cm. Ultrasound of the left axilla was sonographically benign.  On favorite 12th 2018 Loral underwent biopsy of the left breast mass in question, and this showed ([id number]) invasive lobular carcinoma, E-cadherin negative, grade 1, estrogen receptor 100% positive, progesterone receptor 100% positive, both with strong staining intensity, with an MIB-1 of 70%, and HER-2 nonamplified, with a signals ratio of 1.24 and the number per cell 2.10.  Her subsequent history is as detailed below  INTERVAL HISTORY: Lorrane returns today for follow-up and treatment of her estrogen receptor positive breast cancer accompanied by her husband. After the last visit here she underwent left lumpectomy and sentinel lymph node sampling, on 04/02/2017. The final pathology ([id number]) showed an invasive lobular carcinoma  measuring 1.2 cm. This was grade 2. All 3 sentinel lymph nodes were clear. Margins were negative but very close so on 04/16/2017 she underwent further surgery for margin clearance and this was successful ([id number])  We obtained an Oncotype from the definitive surgery which showed a score of 12, predicting a risk of recurrence outside the breast within 10 years of 8% if the patient's only systemic therapy was tamoxifen for 5 years. It also predicts no benefit from chemotherapy.  Accordingly she was referred to radiation, which was completed today!  She is now ready to start anti-estrogens.  REVIEW OF SYSTEMS: Andrianna tolerated radiation remarkably well. She went back to work after surgery, and does not report feeling unusually tired. She had no blistering. She did have some hyperpigmentation, which persists. Aside from this a detailed review of systems today was benign   PAST MEDICAL HISTORY: Past Medical History:  Diagnosis Date  . Acid reflux    takes Omeprazole as needed  . Cancer ([place]) 02/2017   left breast cancer  . Hypertension     PAST SURGICAL HISTORY: Past Surgical History:  Procedure Laterality Date  . BREAST LUMPECTOMY WITH RADIOACTIVE SEED AND SENTINEL LYMPH NODE BIOPSY Left 04/02/2017   Procedure: LEFT BREAST LUMPECTOMY WITH RADIOACTIVE SEED AND SENTINEL LYMPH NODE BIOPSY;  Surgeon: Stark Klein, MD;  Location: [facility];  Service: General;  Laterality: Left;  . BUNIONECTOMY Bilateral   . ENDOMETRIAL ABLATION    . RE-EXCISION OF BREAST LUMPECTOMY Left 04/16/2017   Procedure: RE-EXCISION OF BREAST LUMPECTOMY;  Surgeon: Stark Klein, MD;  Location: [facility];  Service: General;  Laterality: Left;    FAMILY HISTORY Family History  Problem Relation Age of Onset  . Multiple myeloma Mother   . Lymphoma  Father   The patient's father died from lymphoma at the age of 48. The patient's mother died from myeloma at the age of 2. The patient had 5  brothers, 3 sisters. There is no history of breast or ovarian cancer in the family.  GYNECOLOGIC HISTORY:  No LMP recorded. Patient has had an ablation. Menarche age 60, first live birth age 39, the patient is [place] P3. She stopped having periods approximately 2013. She did not take hormone replacement. She used oral contraceptives for approximately 2 years remotely, with no complications.  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 Her husband [name] is a Glass blower/designer. Their daughter [name] works in [place] as a Geophysicist/field seismologist. Son [name] works in Biomedical scientist also in [place]. Daughter Genesis Stanton Kidney is a Electronics engineer hoping to become an Chief Technology Officer. The patient has no grandchildren. She attends the [employer]   ADVANCED DIRECTIVES: Not in place   HEALTH MAINTENANCE: Social History  Substance Use Topics  . Smoking status: Never Smoker  . Smokeless tobacco: Never Used  . Alcohol use Yes     Comment: social     Colonoscopy: 2014  PAP: 2014  Bone density: Never   Allergies  Allergen Reactions  . Codeine Nausea And Vomiting    Current Outpatient Prescriptions  Medication Sig Dispense Refill  . hyaluronate sodium (RADIAPLEXRX) GEL Apply 1 application topically 2 (two) times daily. 2nd tube given 06/16/17    . HYDROcodone-acetaminophen (NORCO) 5-325 MG tablet Take 1-2 tablets by mouth every 6 (six) hours as needed for moderate pain or severe pain. (Patient not taking: Reported on 05/04/2017) 20 tablet 0  . non-metallic deodorant (ALRA) MISC Apply 1 application topically daily as needed.    Marland Kitchen olmesartan-hydrochlorothiazide (BENICAR HCT) 40-12.5 MG per tablet Take 1 tablet by mouth daily.    Marland Kitchen omeprazole (PRILOSEC) 40 MG capsule Take 40 mg by mouth daily.     Marland Kitchen senna (SENOKOT) 8.6 MG tablet Take 1 tablet by mouth as needed for constipation.     No current facility-administered medications for this  visit.     OBJECTIVE:Middle-aged African-American womanIn no acute distress  Vitals:   07/02/17 1547  BP: (!) 152/78  Pulse: 79  Resp: 18  Temp: 97.9 F (36.6 C)     Body mass index is 30.92 kg/m.    ECOG FS:1 - Symptomatic but completely ambulatory  Sclerae unicteric, pupils round and equal Oropharynx clear and moist No cervical or supraclavicular adenopathy Lungs no rales or rhonchi Heart regular rate and rhythm Abd soft, nontender, positive bowel sounds MSK no focal spinal tenderness, no upper extremity lymphedema Neuro: nonfocal, well oriented, appropriate affect Breasts: The right breast is benign. The left breast is status post lumpectomy and radiation. The surgical cosmetic result is excellent. The hyperpigmentation is moderate. There is an area of induration in the upper outer quadrant measuring approximately 4 cm, likely scar tissue. I alerted the patient regarding this so she would not be alarmed if she palpates this later on. Both axillae are benign.   LAB RESULTS:  CMP     Component Value Date/Time   NA 139 03/26/2017 1204   NA 140 02/04/2017 0843   K 4.1 03/26/2017 1204   K 3.6 02/04/2017 0843   CL 101 03/26/2017 1204   CO2 30 03/26/2017 1204   CO2 29 02/04/2017 0843   GLUCOSE 139 (H) 03/26/2017 1204   GLUCOSE 113 02/04/2017 0843   BUN 15  03/26/2017 1204   BUN 14.1 02/04/2017 0843   CREATININE 0.84 03/26/2017 1204   CREATININE 1.0 02/04/2017 0843   CALCIUM 9.4 03/26/2017 1204   CALCIUM 10.3 02/04/2017 0843   PROT 7.3 02/04/2017 0843   ALBUMIN 3.9 02/04/2017 0843   AST 16 02/04/2017 0843   ALT 16 02/04/2017 0843   ALKPHOS 91 02/04/2017 0843   BILITOT 0.42 02/04/2017 0843   GFRNONAA >60 03/26/2017 1204   GFRAA >60 03/26/2017 1204    INo results found for: SPEP, UPEP  Lab Results  Component Value Date   WBC 4.2 02/04/2017   NEUTROABS 1.7 02/04/2017   HGB 13.5 02/04/2017   HCT 41.0 02/04/2017   MCV 84.2 02/04/2017   PLT 244 02/04/2017       Chemistry      Component Value Date/Time   NA 139 03/26/2017 1204   NA 140 02/04/2017 0843   K 4.1 03/26/2017 1204   K 3.6 02/04/2017 0843   CL 101 03/26/2017 1204   CO2 30 03/26/2017 1204   CO2 29 02/04/2017 0843   BUN 15 03/26/2017 1204   BUN 14.1 02/04/2017 0843   CREATININE 0.84 03/26/2017 1204   CREATININE 1.0 02/04/2017 0843      Component Value Date/Time   CALCIUM 9.4 03/26/2017 1204   CALCIUM 10.3 02/04/2017 0843   ALKPHOS 91 02/04/2017 0843   AST 16 02/04/2017 0843   ALT 16 02/04/2017 0843   BILITOT 0.42 02/04/2017 0843       No results found for: LABCA2  No components found for: LABCA125  No results for input(s): INR in the last 168 hours.  Urinalysis    Component Value Date/Time   COLORURINE YELLOW 12/09/2015 2237   APPEARANCEUR CLEAR 12/09/2015 2237   LABSPEC 1.024 12/09/2015 2237   PHURINE 5.5 12/09/2015 2237   GLUCOSEU NEGATIVE 12/09/2015 2237   HGBUR NEGATIVE 12/09/2015 2237   BILIRUBINUR NEGATIVE 12/09/2015 2237   KETONESUR NEGATIVE 12/09/2015 2237   PROTEINUR NEGATIVE 12/09/2015 2237   NITRITE NEGATIVE 12/09/2015 2237   LEUKOCYTESUR NEGATIVE 12/09/2015 2237     STUDIES: No results found.  ELIGIBLE FOR AVAILABLE RESEARCH PROTOCOL: no  ASSESSMENT: 56 y.o. [place] woman status post left breast upper outer quadrant biopsy 01/26/2017, for a clinical T1BN0, stage IA invasive lobular carcinoma, grade 1, E-cadherin negative, estrogen and progesterone receptor positive, HER-2 not amplified, with an MIB-1 of 70%.   (1) Status post left lumpectomy and sentinel lymph node sampling 04/02/2017 for a pT1c pN0, stage IA invasive lobular carcinoma, grade 2, with close margins, which were improved with additional surgery 04/16/2017 no residual carcinoma)  (2) Oncotype DX score of 12 predicts a 10 year risk of outside the breast recurrence of 8% if the patient's only systemic treatment is tamoxifen for 5 years. It also vertex no benefit from  chemotherapy  (3) adjuvant radiation Completed 07/02/2017  (4) to start tamoxifen 08/15/2017  PLAN: Bruna has completed local therapy for her breast cancer now she is ready to consider systemic treatment, which in her case means anti-estrogens alone. Today we discussed the difference between tamoxifen and anastrozole in detail. She understands that anastrozole and the aromatase inhibitors in general work by blocking estrogen production. Accordingly vaginal dryness, decrease in bone density, and of course hot flashes can result. The aromatase inhibitors can also negatively affect the cholesterol profile, although that is a minor effect. One out of 5 women on aromatase inhibitors we will feel "old and achy". This arthralgia/myalgia syndrome, which resembles fibromyalgia clinically, does  resolve with stopping the medications. Accordingly this is not a reason to not try an aromatase inhibitor but it is a frequent reason to stop it (in other words 20% of women will not be able to tolerate these medications).  Tamoxifen on the other hand does not block estrogen production. It does not "take away a woman's estrogen". It blocks the estrogen receptor in breast cells. Like anastrozole, it can also cause hot flashes. As opposed to anastrozole, tamoxifen has many estrogen-like effects. It is technically an estrogen receptor modulator. This means that in some tissues tamoxifen works like estrogen-- for example it helps strengthen the bones. It tends to improve the cholesterol profile. It can cause thickening of the endometrial lining, and even endometrial polyps or rarely cancer of the uterus.(The risk of uterine cancer due to tamoxifen is one additional cancer per thousand women year). It can cause vaginal wetness or stickiness. It can cause blood clots through this estrogen-like effect--the risk of blood clots with tamoxifen is exactly the same as with birth control pills or hormone replacement.  Neither of these  agents causes mood changes or weight gain, despite the popular belief that they can have these side effects. We have data from studies comparing either of these drugs with placebo, and in those cases the control group had the same amount of weight gain and depression as the group that took the drug.  After much discussion we decided we would try tamoxifen in her case. She is can it take a "vacation" from cancer and not start the medication until September 1.  Accordingly I will see her mid November. If she is tolerating it well by then very likely she will have little trouble continuing for a total of 5 years. Otherwise we will switch to anastrozole.  She has a good understanding of this plan. She agrees with it. She knows a goal of treatment in her case is cure. She will call with any problems that may develop before her next visit here.   Chauncey Cruel, MD   07/02/2017 4:01 PM Medical Oncology and Hematology [facility] [address] [place],  [address] Tel. [phone]    Fax. [phone]

## 2017-07-03 ENCOUNTER — Encounter: Payer: Self-pay | Admitting: *Deleted

## 2017-07-03 ENCOUNTER — Ambulatory Visit: Payer: BC Managed Care – PPO

## 2017-07-06 ENCOUNTER — Encounter: Payer: Self-pay | Admitting: Radiation Oncology

## 2017-07-06 ENCOUNTER — Telehealth: Payer: Self-pay | Admitting: Oncology

## 2017-07-06 NOTE — Progress Notes (Signed)
  Radiation Oncology         (336) [phone] ________________________________  Name: Shelly Beasley MRN: [id number]  Date: 07/06/2017  DOB: 11/27/1961  End of Treatment Note  Diagnosis:  Malignant neoplasm of upper-outer quadrant of left breast in female, estrogen receptor positive ([place])  Stage IA (cT1bN0) invasive lobular carcinoma of the left breast (ER+,PR+,HER2-)   Indication for treatment: Curative  Radiation treatment dates:  05/26/2017 - 07/02/2017   Site/dose: Left breast/ 1.8 Gy/fx, 28 txs, 50.4 Gy    Beams/energy:  Photon / 6X  Narrative: The patient tolerated radiation treatment relatively well. She experienced mild fatigue. She also developed some hyperpigmentation changes in the breast but no skin breakdown.  Plan: The patient has completed radiation treatment. The patient will return to radiation oncology clinic for routine followup in one month. I advised them to call or return sooner if they have any questions or concerns related to their recovery or treatment.  -----------------------------------  Blair Promise, PhD, MD  This document serves as a record of services personally performed by [name], MD. It was created on his behalf by Valeta Harms, a trained medical scribe. The creation of this record is based on the scribe's personal observations and the provider's statements to them. This document has been checked and approved by the attending provider.

## 2017-07-06 NOTE — Telephone Encounter (Signed)
sw pt to confirm SCP appt 10/1 at 10 am er sch msg

## 2017-07-28 ENCOUNTER — Encounter: Payer: Self-pay | Admitting: Oncology

## 2017-08-03 ENCOUNTER — Encounter (INDEPENDENT_AMBULATORY_CARE_PROVIDER_SITE_OTHER): Payer: Self-pay

## 2017-08-03 ENCOUNTER — Encounter: Payer: Self-pay | Admitting: Radiation Oncology

## 2017-08-03 ENCOUNTER — Ambulatory Visit
Admission: RE | Admit: 2017-08-03 | Discharge: 2017-08-03 | Disposition: A | Discharge status: Home / Self Care | Payer: BC Managed Care – PPO | Source: Ambulatory Visit | Attending: Radiation Oncology | Admitting: Radiation Oncology

## 2017-08-03 VITALS — BP 118/72 | HR 72 | Temp 98.1°F | Resp 18 | Wt 188.6 lb

## 2017-08-03 DIAGNOSIS — Z170 Estrogen receptor positive status [ER+]: Secondary | ICD-10-CM | POA: Insufficient documentation

## 2017-08-03 DIAGNOSIS — C50412 Malignant neoplasm of upper-outer quadrant of left female breast: Secondary | ICD-10-CM

## 2017-08-03 DIAGNOSIS — Z885 Allergy status to narcotic agent status: Secondary | ICD-10-CM | POA: Insufficient documentation

## 2017-08-03 DIAGNOSIS — Z923 Personal history of irradiation: Secondary | ICD-10-CM | POA: Diagnosis not present

## 2017-08-03 DIAGNOSIS — Z79899 Other long term (current) drug therapy: Secondary | ICD-10-CM | POA: Insufficient documentation

## 2017-08-03 NOTE — Progress Notes (Signed)
  Radiation Oncology         [phone] ________________________________  Name: Shelly Beasley MRN: [id number]  Date: 08/03/2017  DOB: 21-Dec-1960  Follow-Up Visit Note  CC: Harlan Stains, MD  Magrinat, [name], MD    ICD-10-CM   1. Malignant neoplasm of upper-outer quadrant of left breast in female, estrogen receptor positive ([place]) C50.412    Z17.0     Diagnosis: Clinical stage IA (cT1bN0) invasive lobular carcinoma of the left breast (ER+,PR+,HER2-)  Interval Since Last Radiation:  34 days  Narrative:  The patient returns today for routine follow-up.  Overall she has been doing well. She does note some sharp pain to the left breast which has been intermittent and improving since her treatment. This pain has not been waking her up from sleep. Her fatigue has completely resolved. She notes that she will be beginning Tamoxifen September 1st. She denies nipple discharge or bleeding, arm swelling, cough, dyspnea, shortness of breath, or any other associated symptoms.                               ALLERGIES:  is allergic to codeine.  Meds: Current Outpatient Prescriptions  Medication Sig Dispense Refill  . olmesartan-hydrochlorothiazide (BENICAR HCT) 40-12.5 MG per tablet Take 1 tablet by mouth daily.    Marland Kitchen omeprazole (PRILOSEC) 40 MG capsule Take 40 mg by mouth daily.     Marland Kitchen senna (SENOKOT) 8.6 MG tablet Take 1 tablet by mouth as needed for constipation.     No current facility-administered medications for this encounter.    REVIEW OF SYSTEMS: A 10+ POINT REVIEW OF SYSTEMS WAS OBTAINED including neurology, dermatology, psychiatry, cardiac, respiratory, lymph, extremities, GI, GU, musculoskeletal, constitutional, reproductive, HEENT. All pertinent positives are noted in the HPI. All others are negative.  Physical Findings: The patient is in no acute distress. Patient is alert and oriented.  weight is 188 lb 9.6 oz (85.5 kg). Her oral temperature is 98.1 F (36.7 C). Her blood  pressure is 118/72 and her pulse is 72. Her respiration is 18 and oxygen saturation is 99%. .  No significant changes. Lungs are clear to auscultation bilaterally. Heart has regular rate and rhythm. No palpable cervical, supraclavicular, or axillary adenopathy. Abdomen soft, non-tender, normal bowel sounds. Breast: Right breast without mass or discharge. Left breast: pt has some continued hyperpigmentation changes. No dominate mass, nipple discharge or bleeding. Pt's skin is well healed.   Lab Findings: Lab Results  Component Value Date   WBC 4.2 02/04/2017   HGB 13.5 02/04/2017   HCT 41.0 02/04/2017   MCV 84.2 02/04/2017   PLT 244 02/04/2017    Radiographic Findings: No results found.  Impression:  The patient is recovering from the effects of radiation.  No evidence of recurrence on clinical exam.   Plan:  PRN f/u in Radiation Oncology, with continued close f/u in medical oncology.   ____________________________________  This document serves as a record of services personally performed by [name], MD. It was created on his behalf by Reola Mosher, a trained medical scribe. The creation of this record is based on the scribe's personal observations and the provider's statements to them. This document has been checked and approved by the attending provider.  -----------------------------------  Blair Promise, PhD, MD

## 2017-08-03 NOTE — Progress Notes (Signed)
Shelly Beasley is here today for 1 month follow up for left breast radiation.  Patient denies any pain presently but does report having some nerve pain occasionally in the left breast.  Patient reports no concerns for fatigue, decreased appetite.  She reports using the Radiaplex cream BID.  Patient reports skin has begun to lighten.    VSS.  No questions or concerns.  Vitals:   08/03/17 1536  BP: 118/72  Pulse: 72  Resp: 18  Temp: 98.1 F (36.7 C)  TempSrc: Oral  SpO2: 99%  Weight: 188 lb 9.6 oz (85.5 kg)   Wt Readings from Last 3 Encounters:  08/03/17 188 lb 9.6 oz (85.5 kg)  07/02/17 185 lb 12.8 oz (84.3 kg)  05/04/17 185 lb (83.9 kg)

## 2017-09-09 ENCOUNTER — Telehealth: Payer: Self-pay

## 2017-09-09 NOTE — Telephone Encounter (Signed)
Called patient to confirm SCP visit appt on 09/14/17.  Patient states she will be here.

## 2017-09-14 ENCOUNTER — Ambulatory Visit (HOSPITAL_BASED_OUTPATIENT_CLINIC_OR_DEPARTMENT_OTHER): Payer: BC Managed Care – PPO | Admitting: Adult Health

## 2017-09-14 ENCOUNTER — Encounter: Payer: Self-pay | Admitting: Adult Health

## 2017-09-14 VITALS — BP 128/73 | HR 70 | Temp 98.2°F | Resp 18 | Ht 65.0 in | Wt 184.0 lb

## 2017-09-14 DIAGNOSIS — Z170 Estrogen receptor positive status [ER+]: Secondary | ICD-10-CM

## 2017-09-14 DIAGNOSIS — C50412 Malignant neoplasm of upper-outer quadrant of left female breast: Secondary | ICD-10-CM

## 2017-09-14 NOTE — Progress Notes (Signed)
CLINIC:  Survivorship   REASON FOR VISIT:  Routine follow-up post-treatment for a recent history of breast cancer.  BRIEF ONCOLOGIC HISTORY:    Malignant neoplasm of upper-outer quadrant of left breast in female, estrogen receptor positive ([place])   01/26/2017 Initial Biopsy    Left breast upper outer quadrant biopsy: ILC, grade 1, ER +(100%), PR+(100%), Ki-67 70%, HER-2 negative (ratio 1.24).       02/03/2017 Initial Diagnosis    Malignant neoplasm of upper-outer quadrant of left breast in female, estrogen receptor positive ([place])     04/02/2017 Surgery    Left Lumpectomy and SLNB: ILC, 1.2 cm, grade 2, close margins, 3 SLN negative      04/02/2017 Oncotype testing    12/8%, low risk      04/16/2017 Surgery    Re-excision, margins negative      05/26/2017 - 07/02/2017 Radiation Therapy    Adjuvant radiation ([place]): Left breast/ 1.8 Gy/fx, 28 txs, 50.4 Gy      08/2017 -  Anti-estrogen oral therapy    Tamoxifen daily       INTERVAL HISTORY:  Shelly Beasley presents to the [facility] today for our initial meeting to review her survivorship care plan detailing her treatment course for breast cancer, as well as monitoring long-term side effects of that treatment, education regarding health maintenance, screening, and overall wellness and health promotion.     Overall, Shelly Beasley reports feeling quite well.  She is taking Tamoxifen daily and is not having any issues with taking it.  She is toelrating this medication well.  Dr. Pamala Hurry is her GYN and she sees her annually.     REVIEW OF SYSTEMS:  Review of Systems  Constitutional: Negative for appetite change, chills, fatigue, fever and unexpected weight change.  HENT:   Negative for hearing loss and lump/mass.   Eyes: Negative for eye problems and icterus.  Respiratory: Negative for chest tightness, cough and shortness of breath.   Cardiovascular: Negative for chest pain, leg swelling and palpitations.  Gastrointestinal:  Negative for abdominal distention, abdominal pain, constipation, diarrhea, nausea and vomiting.  Endocrine: Negative for hot flashes.  Musculoskeletal: Negative for arthralgias.  Skin: Negative for itching and rash.  Neurological: Negative for dizziness, extremity weakness, headaches and numbness.  Hematological: Negative for adenopathy. Does not bruise/bleed easily.  Psychiatric/Behavioral: Negative for depression. The patient is not nervous/anxious.    Breast: Denies any new nodularity, masses, tenderness, nipple changes, or nipple discharge.      ONCOLOGY TREATMENT TEAM:  1. Surgeon:  Dr. Barry Dienes at [facility] 2. Medical Oncologist: Dr. Jana Hakim  3. Radiation Oncologist: Dr. Sondra Come    PAST MEDICAL/SURGICAL HISTORY:  Past Medical History:  Diagnosis Date  . Acid reflux    takes Omeprazole as needed  . Cancer ([place]) 02/2017   left breast cancer  . History of radiation therapy 05/26/17-07/02/17   left breast 50.4 Gy  . Hypertension    Past Surgical History:  Procedure Laterality Date  . BREAST LUMPECTOMY WITH RADIOACTIVE SEED AND SENTINEL LYMPH NODE BIOPSY Left 04/02/2017   Procedure: LEFT BREAST LUMPECTOMY WITH RADIOACTIVE SEED AND SENTINEL LYMPH NODE BIOPSY;  Surgeon: Stark Klein, MD;  Location: [place];  Service: General;  Laterality: Left;  . BUNIONECTOMY Bilateral   . ENDOMETRIAL ABLATION    . RE-EXCISION OF BREAST LUMPECTOMY Left 04/16/2017   Procedure: RE-EXCISION OF BREAST LUMPECTOMY;  Surgeon: Stark Klein, MD;  Location: [place];  Service: General;  Laterality: Left;  ALLERGIES:  Allergies  Allergen Reactions  . Codeine Nausea And Vomiting     CURRENT MEDICATIONS:  Outpatient Encounter Prescriptions as of 09/14/2017  Medication Sig  . olmesartan-hydrochlorothiazide (BENICAR HCT) 40-12.5 MG per tablet Take 1 tablet by mouth daily.  Marland Kitchen omeprazole (PRILOSEC) 40 MG capsule Take 40 mg by mouth as needed.   .  senna (SENOKOT) 8.6 MG tablet Take 1 tablet by mouth as needed for constipation.  . tamoxifen (NOLVADEX) 20 MG tablet    No facility-administered encounter medications on file as of 09/14/2017.      ONCOLOGIC FAMILY HISTORY:  Family History  Problem Relation Age of Onset  . Multiple myeloma Mother   . Lymphoma Father        SOCIAL HISTORY:  Shelly Beasley is married and lives with her husband in [place], [place].  She has 3 children, one lives with her, the others in the area.  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  She denies any current or history of tobacco, alcohol, or illicit drug use.     PHYSICAL EXAMINATION:  Vital Signs:   Vitals:   09/14/17 1043  BP: 128/73  Pulse: 70  Resp: 18  Temp: 98.2 F (36.8 C)  SpO2: 100%   Filed Weights   09/14/17 1043  Weight: 184 lb (83.5 kg)   General: Well-nourished, well-appearing female in no acute distress.  She is unaccompanied today.   HEENT: Head is normocephalic.  Pupils equal and reactive to light. Conjunctivae clear without exudate.  Sclerae anicteric. Oral mucosa is pink, moist.  Oropharynx is pink without lesions or erythema.  Lymph: No cervical, supraclavicular, or infraclavicular lymphadenopathy noted on palpation.  Cardiovascular: Regular rate and rhythm.Marland Kitchen Respiratory: Clear to auscultation bilaterally. Chest expansion symmetric; breathing non-labored.  GI: Abdomen soft and round; non-tender, non-distended. Bowel sounds normoactive.  GU: Deferred.  Neuro: No focal deficits. Steady gait.  Psych: Mood and affect normal and appropriate for situation.  Extremities: No edema. MSK: No focal spinal tenderness to palpation.  Full range of motion in bilateral upper extremities Skin: Warm and dry.  LABORATORY DATA:  None for this visit.  DIAGNOSTIC IMAGING:  None for this visit.      ASSESSMENT AND PLAN:  [name] is a pleasant 56 y.o. female  with Stage IA left breast invasive ductal carcinoma, ER+/PR+/HER2-, diagnosed in 01/2017, treated with lumpectomy, adjuvant radiation therapy, and anti-estrogen therapy with Tamoxifen daily beginning in 08/2017.  She presents to the Survivorship Clinic for our initial meeting and routine follow-up post-completion of treatment for breast cancer.    1. Stage IA left breast cancer:  Shelly Beasley is continuing to recover from definitive treatment for breast cancer. She will follow-up with her medical oncologist, Dr. Jana Hakim in 10/2017 with history and physical exam per surveillance protocol.  She will continue her anti-estrogen therapy with Tamoxifen. Thus far, she is tolerating the Tamoxifen well, with minimal side effects. She was instructed to make Dr. Jana Hakim or myself aware if she begins to experience any worsening side effects of the medication and I could see her back in clinic to help manage those side effects, as needed. Though the incidence is low, there is an associated risk of endometrial cancer with anti-estrogen therapies like Tamoxifen.  Shelly Beasley was encouraged to contact Dr. Jana Hakim or myself with any vaginal bleeding while taking Tamoxifen. Other side effects of Tamoxifen were again reviewed with her as well. Today, a comprehensive  survivorship care plan and treatment summary was reviewed with the patient today detailing her breast cancer diagnosis, treatment course, potential late/long-term effects of treatment, appropriate follow-up care with recommendations for the future, and patient education resources.  A copy of this summary, along with a letter will be sent to the patient's primary care provider via mail/fax/In Basket message after today's visit.    2. Bone health:  Given Shelly Beasley's history of breast cancer, she is at slight risk for bone demineralization.    She was given education on specific activities to promote bone health.  3. Cancer screening:  Due to Shelly Beasley's history and  her age, she should receive screening for skin cancers, colon cancer, and gynecologic cancers.  The information and recommendations are listed on the patient's comprehensive care plan/treatment summary and were reviewed in detail with the patient.    4. Health maintenance and wellness promotion: Shelly Beasley was encouraged to consume 5-7 servings of fruits and vegetables per day. We reviewed the "Nutrition Rainbow" handout, as well as the handout "Take Control of Your Health and Reduce Your Cancer Risk" from the [employer].  She was also encouraged to engage in moderate to vigorous exercise for 30 minutes per day most days of the week. We discussed the [employer] fitness program, which is designed for cancer survivors to help them become more physically fit after cancer treatments.  She was instructed to limit her alcohol consumption and continue to abstain from tobacco use.     5. Support services/counseling: It is not uncommon for this period of the patient's cancer care trajectory to be one of many emotions and stressors.  We discussed an opportunity for her to participate in the next session of [facility] ("Finding Your New Normal") support group series designed for patients after they have completed treatment.   Shelly Beasley was encouraged to take advantage of our many other support services programs, support groups, and/or counseling in coping with her new life as a cancer survivor after completing anti-cancer treatment.  She was offered support today through active listening and expressive supportive counseling.  She was given information regarding our available services and encouraged to contact me with any questions or for help enrolling in any of our support group/programs.    Dispo:   -Return to cancer center for follow up with Dr. Jana Hakim in 10/2017  -Mammogram due in 01/2018 -Follow up with Dr. Barry Dienes at [facility] 10/2017 -I offered Shelly Beasley to move or change one of her  appointments in November, however she declined and wants to keep them as is.  -She is welcome to return back to the Survivorship Clinic at any time; no additional follow-up needed at this time.  -Consider referral back to survivorship as a long-term survivor for continued surveillance  A total of (30) minutes of face-to-face time was spent with this patient with greater than 50% of that time in counseling and care-coordination.   Gardenia Phlegm, NP Survivorship Program [facility] [phone]   Note: PRIMARY CARE PROVIDER Harlan Stains, [place] [phone]

## 2017-09-15 ENCOUNTER — Telehealth: Payer: Self-pay | Admitting: Adult Health

## 2017-09-15 NOTE — Telephone Encounter (Signed)
No 10/1 los °

## 2017-11-02 ENCOUNTER — Other Ambulatory Visit: Payer: Self-pay | Admitting: *Deleted

## 2017-11-02 DIAGNOSIS — Z170 Estrogen receptor positive status [ER+]: Principal | ICD-10-CM

## 2017-11-02 DIAGNOSIS — C50412 Malignant neoplasm of upper-outer quadrant of left female breast: Secondary | ICD-10-CM

## 2017-11-02 NOTE — Progress Notes (Signed)
[place]  [phone] [phone]     ID: Shelly Beasley DOB: Jul 14, 1961  MR#: [id number]  DXI#:[id number]  Patient Care Team: Harlan Stains, MD as PCP - General (Family Medicine) Stark Klein, MD as Consulting Physician (General Surgery) Alpa Salvo, [name], MD as Consulting Physician (Oncology) [name], MD as Consulting Physician (Radiation Oncology) [name], MD as Consulting Physician (Orthopedic Surgery) [name], [name], NP as Nurse Practitioner (Hematology and Oncology) OTHER MD:  CHIEF COMPLAINT: Estrogen receptor positive breast cancer  CURRENT TREATMENT: Tamoxifen  BREAST CANCER HISTORY: From the original intake note:  [name] had routine bilateral screening mammography with tomography at [facility] 01/16/2017. This showed a possible mass in the left breast. On 01/23/2017 she underwent left diagnostic mammography with tomography and ultrasonography. The breast density was category C. In the lateral posterior breast, there was an irregular 1 cm mass which by ultrasound was located at 8 cm from the nipple at the 1:30 o'clock position. This was irregular, hypoechoic, and measured 1.0 cm. Ultrasound of the left axilla was sonographically benign.  On favorite 12th 2018 Jaquasia underwent biopsy of the left breast mass in question, and this showed ([id number]) invasive lobular carcinoma, E-cadherin negative, grade 1, estrogen receptor 100% positive, progesterone receptor 100% positive, both with strong staining intensity, with an MIB-1 of 70%, and HER-2 nonamplified, with a signals ratio of 1.24 and the number per cell 2.10.  Her subsequent history is as detailed below  INTERVAL HISTORY: Shelly Beasley returns today for follow-up and treatment of her estrogen receptor positive breast cancer, accompanied by her husband. She continues on tamoxifen, which she pays $15 for 3 months. She reports that she might have a small occasional  headache, which she doesn't take any medication for. She reports having hot flashes that come and go, and they are not intense. She has increased vaginal wetness which is not a major issue for her.  REVIEW OF SYSTEMS: [name] reports that she has been well overall. She reports that she walks every Monday and Wednesday for about an hour. She notes that she is not currently menstruating. She notes some tenderness and discomfort in her right breast in some areas that prevent her from sleeping on the side. She denies unusual headaches, visual changes, nausea, vomiting, or dizziness. There has been no unusual cough, phlegm production, or pleurisy. This been no change in bowel or bladder habits. She denies unexplained fatigue or unexplained weight loss, bleeding, rash, or fever. A detailed review of systems was otherwise stable.   PAST MEDICAL HISTORY: Past Medical History:  Diagnosis Date  . Acid reflux    takes Omeprazole as needed  . Cancer ([place]) 02/2017   left breast cancer  . History of radiation therapy 05/26/17-07/02/17   left breast 50.4 Gy  . Hypertension     PAST SURGICAL HISTORY: Past Surgical History:  Procedure Laterality Date  . BREAST LUMPECTOMY WITH RADIOACTIVE SEED AND SENTINEL LYMPH NODE BIOPSY Left 04/02/2017   Procedure: LEFT BREAST LUMPECTOMY WITH RADIOACTIVE SEED AND SENTINEL LYMPH NODE BIOPSY;  Surgeon: Stark Klein, MD;  Location: [place];  Service: General;  Laterality: Left;  . BUNIONECTOMY Bilateral   . ENDOMETRIAL ABLATION    . RE-EXCISION OF BREAST LUMPECTOMY Left 04/16/2017   Procedure: RE-EXCISION OF BREAST LUMPECTOMY;  Surgeon: Stark Klein, MD;  Location: [place];  Service: General;  Laterality: Left;    FAMILY HISTORY Family History  Problem Relation Age of Onset  . Multiple  myeloma Mother   . Lymphoma Father   The patient's father died from lymphoma at the age of 67. The patient's mother died from myeloma at the age of 61.  The patient had 5 brothers, 3 sisters. There is no history of breast or ovarian cancer in the family.  GYNECOLOGIC HISTORY:  No LMP recorded. Patient has had an ablation. Menarche age 15, first live birth age 56, the patient is [place] P3. She stopped having periods approximately 2013. She did not take hormone replacement. She used oral contraceptives for approximately 2 years remotely, with no complications.  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 Her husband [name] is a Glass blower/designer. Their daughter [name] works in [place] as a Geophysicist/field seismologist. Son [name] works in Biomedical scientist also in [place]. Daughter Genesis Stanton Kidney is a Electronics engineer hoping to become an Chief Technology Officer. The patient has no grandchildren. She attends the [employer]   ADVANCED DIRECTIVES: Not in place   HEALTH MAINTENANCE: Social History   Tobacco Use  . Smoking status: Never Smoker  . Smokeless tobacco: Never Used  Substance Use Topics  . Alcohol use: Yes    Comment: social  . Drug use: No     Colonoscopy: 2014  PAP: 2014  Bone density: Never   Allergies  Allergen Reactions  . Codeine Nausea And Vomiting    Current Outpatient Medications  Medication Sig Dispense Refill  . olmesartan-hydrochlorothiazide (BENICAR HCT) 40-12.5 MG per tablet Take 1 tablet by mouth daily.    Marland Kitchen omeprazole (PRILOSEC) 40 MG capsule Take 40 mg by mouth as needed.     . senna (SENOKOT) 8.6 MG tablet Take 1 tablet by mouth as needed for constipation.    . tamoxifen (NOLVADEX) 20 MG tablet      No current facility-administered medications for this visit.     OBJECTIVE:Middle-aged African-American woman who appears well  Vitals:   11/03/17 1216  BP: 121/77  Pulse: 70  Resp: 20  Temp: 98.4 F (36.9 C)  SpO2: 100%     Body mass index is 30.15 kg/m.    ECOG FS:0 - Asymptomatic  Sclerae unicteric, EOMs intact Oropharynx clear and moist No  cervical or supraclavicular adenopathy Lungs no rales or rhonchi Heart regular rate and rhythm Abd soft, nontender, positive bowel sounds MSK no focal spinal tenderness, no upper extremity lymphedema Neuro: nonfocal, well oriented, appropriate affect Breasts: The right breast is unremarkable.  The left breast is status post lumpectomy and radiation.  There are the expected changes but no suggestion of disease recurrence.  Both axillae are benign.  LAB RESULTS:  CMP     Component Value Date/Time   NA 140 11/03/2017 1117   K 3.4 (L) 11/03/2017 1117   CL 101 03/26/2017 1204   CO2 28 11/03/2017 1117   GLUCOSE 101 11/03/2017 1117   BUN 14.7 11/03/2017 1117   CREATININE 0.9 11/03/2017 1117   CALCIUM 9.3 11/03/2017 1117   PROT 6.9 11/03/2017 1117   ALBUMIN 3.6 11/03/2017 1117   AST 18 11/03/2017 1117   ALT 14 11/03/2017 1117   ALKPHOS 70 11/03/2017 1117   BILITOT 0.37 11/03/2017 1117   GFRNONAA >60 03/26/2017 1204   GFRAA >60 03/26/2017 1204    INo results found for: SPEP, UPEP  Lab Results  Component Value Date   WBC 3.4 (L) 11/03/2017   NEUTROABS 1.5 11/03/2017   HGB 12.9 11/03/2017   HCT 39.0 11/03/2017   MCV 84.1  11/03/2017   PLT 208 11/03/2017      Chemistry      Component Value Date/Time   NA 140 11/03/2017 1117   K 3.4 (L) 11/03/2017 1117   CL 101 03/26/2017 1204   CO2 28 11/03/2017 1117   BUN 14.7 11/03/2017 1117   CREATININE 0.9 11/03/2017 1117      Component Value Date/Time   CALCIUM 9.3 11/03/2017 1117   ALKPHOS 70 11/03/2017 1117   AST 18 11/03/2017 1117   ALT 14 11/03/2017 1117   BILITOT 0.37 11/03/2017 1117       No results found for: LABCA2  No components found for: LABCA125  No results for input(s): INR in the last 168 hours.  Urinalysis    Component Value Date/Time   COLORURINE YELLOW 12/09/2015 2237   APPEARANCEUR CLEAR 12/09/2015 2237   LABSPEC 1.024 12/09/2015 2237   PHURINE 5.5 12/09/2015 2237   GLUCOSEU NEGATIVE 12/09/2015  2237   HGBUR NEGATIVE 12/09/2015 2237   BILIRUBINUR NEGATIVE 12/09/2015 2237   KETONESUR NEGATIVE 12/09/2015 2237   PROTEINUR NEGATIVE 12/09/2015 2237   NITRITE NEGATIVE 12/09/2015 2237   LEUKOCYTESUR NEGATIVE 12/09/2015 2237     STUDIES: No results found.  ELIGIBLE FOR AVAILABLE RESEARCH PROTOCOL: no  ASSESSMENT: 56 y.o. [place] woman status post left breast upper outer quadrant biopsy 01/26/2017, for a clinical T1BN0, stage IA invasive lobular carcinoma, grade 1, E-cadherin negative, estrogen and progesterone receptor positive, HER-2 not amplified, with an MIB-1 of 70%.   (1) Status post left lumpectomy and sentinel lymph node sampling 04/02/2017 for a pT1c pN0, stage IA invasive lobular carcinoma, grade 2, with close margins, which were improved with additional surgery 04/16/2017 no residual carcinoma)  (2) Oncotype DX score of 12 predicts a 10 year risk of outside the breast recurrence of 8% if the patient's only systemic treatment is tamoxifen for 5 years. It also vertex no benefit from chemotherapy  (3) adjuvant radiation completed 07/02/2017  (4) started tamoxifen 08/15/2017  PLAN: Seng is now about 6 months out from her definitive surgery.  She is recovering well from that and radiation.  Importantly she is tolerating tamoxifen without significant side effects  The plan will be to continue tamoxifen for a minimum of 5 years.  She was scheduled to see surgery later this month.  That probably is overkill.  If that visit can be changed to February then she can see me again next May  I have encouraged her to increase her exercise program from 2 days a week to 4  She knows to call for any issues that may develop before the next visit.  Sky Borboa, [name], MD  11/03/17 12:28 PM Medical Oncology and Hematology [facility] [address] [place], [place] [address] Tel. [phone]    Fax. [phone]  This document serves as a record of services  personally performed by Lurline Del, MD. It was created on his behalf by Sheron Nightingale, a trained medical scribe. The creation of this record is based on the scribe's personal observations and the provider's statements to them.   I have reviewed the above documentation for accuracy and completeness, and I agree with the above.

## 2017-11-03 ENCOUNTER — Other Ambulatory Visit (HOSPITAL_BASED_OUTPATIENT_CLINIC_OR_DEPARTMENT_OTHER): Payer: BC Managed Care – PPO

## 2017-11-03 ENCOUNTER — Ambulatory Visit (HOSPITAL_BASED_OUTPATIENT_CLINIC_OR_DEPARTMENT_OTHER): Payer: BC Managed Care – PPO | Admitting: Oncology

## 2017-11-03 VITALS — BP 121/77 | HR 70 | Temp 98.4°F | Resp 20 | Ht 65.0 in | Wt 181.2 lb

## 2017-11-03 DIAGNOSIS — Z170 Estrogen receptor positive status [ER+]: Secondary | ICD-10-CM | POA: Diagnosis not present

## 2017-11-03 DIAGNOSIS — C50412 Malignant neoplasm of upper-outer quadrant of left female breast: Secondary | ICD-10-CM | POA: Diagnosis not present

## 2017-11-03 LAB — CBC WITH DIFFERENTIAL/PLATELET
BASO%: 0.5 % (ref 0.0–2.0)
BASOS ABS: 0 10*3/uL (ref 0.0–0.1)
EOS%: 4.5 % (ref 0.0–7.0)
Eosinophils Absolute: 0.2 10*3/uL (ref 0.0–0.5)
HCT: 39 % (ref 34.8–46.6)
HGB: 12.9 g/dL (ref 11.6–15.9)
LYMPH#: 1.4 10*3/uL (ref 0.9–3.3)
LYMPH%: 42.5 % (ref 14.0–49.7)
MCH: 27.8 pg (ref 25.1–34.0)
MCHC: 33 g/dL (ref 31.5–36.0)
MCV: 84.1 fL (ref 79.5–101.0)
MONO#: 0.3 10*3/uL (ref 0.1–0.9)
MONO%: 8.7 % (ref 0.0–14.0)
NEUT#: 1.5 10*3/uL (ref 1.5–6.5)
NEUT%: 43.8 % (ref 38.4–76.8)
Platelets: 208 10*3/uL (ref 145–400)
RBC: 4.64 10*6/uL (ref 3.70–5.45)
RDW: 14.2 % (ref 11.2–14.5)
WBC: 3.4 10*3/uL — AB (ref 3.9–10.3)

## 2017-11-03 LAB — COMPREHENSIVE METABOLIC PANEL
ALK PHOS: 70 U/L (ref 40–150)
ALT: 14 U/L (ref 0–55)
ANION GAP: 8 meq/L (ref 3–11)
AST: 18 U/L (ref 5–34)
Albumin: 3.6 g/dL (ref 3.5–5.0)
BILIRUBIN TOTAL: 0.37 mg/dL (ref 0.20–1.20)
BUN: 14.7 mg/dL (ref 7.0–26.0)
CO2: 28 meq/L (ref 22–29)
Calcium: 9.3 mg/dL (ref 8.4–10.4)
Chloride: 104 mEq/L (ref 98–109)
Creatinine: 0.9 mg/dL (ref 0.6–1.1)
Glucose: 101 mg/dl (ref 70–140)
Potassium: 3.4 mEq/L — ABNORMAL LOW (ref 3.5–5.1)
Sodium: 140 mEq/L (ref 136–145)
TOTAL PROTEIN: 6.9 g/dL (ref 6.4–8.3)

## 2017-11-18 ENCOUNTER — Telehealth: Payer: Self-pay | Admitting: Oncology

## 2017-11-18 NOTE — Telephone Encounter (Signed)
Spoke with patient re appt. Schedule mailed °

## 2018-01-25 ENCOUNTER — Ambulatory Visit
Admission: RE | Admit: 2018-01-25 | Discharge: 2018-01-25 | Disposition: A | Discharge status: Home / Self Care | Payer: BC Managed Care – PPO | Source: Ambulatory Visit | Attending: Adult Health | Admitting: Adult Health

## 2018-01-25 DIAGNOSIS — Z170 Estrogen receptor positive status [ER+]: Principal | ICD-10-CM

## 2018-01-25 DIAGNOSIS — C50412 Malignant neoplasm of upper-outer quadrant of left female breast: Secondary | ICD-10-CM

## 2018-01-25 HISTORY — DX: Malignant neoplasm of unspecified site of unspecified female breast: C50.919

## 2018-01-25 HISTORY — DX: Personal history of irradiation: Z92.3

## 2018-02-10 ENCOUNTER — Ambulatory Visit: Payer: BC Managed Care – PPO | Admitting: Physical Therapy

## 2018-02-12 ENCOUNTER — Ambulatory Visit: Payer: BC Managed Care – PPO | Admitting: Physical Therapy

## 2018-02-24 ENCOUNTER — Ambulatory Visit: Payer: BC Managed Care – PPO | Admitting: Rehabilitation

## 2018-03-02 ENCOUNTER — Ambulatory Visit: Payer: BC Managed Care – PPO | Attending: General Surgery | Admitting: Physical Therapy

## 2018-03-02 ENCOUNTER — Other Ambulatory Visit: Payer: Self-pay

## 2018-03-02 DIAGNOSIS — I890 Lymphedema, not elsewhere classified: Secondary | ICD-10-CM | POA: Diagnosis present

## 2018-03-02 DIAGNOSIS — L599 Disorder of the skin and subcutaneous tissue related to radiation, unspecified: Secondary | ICD-10-CM | POA: Insufficient documentation

## 2018-03-02 NOTE — Therapy (Signed)
[place], [place], [address] Phone: [phone]   Fax:  [phone]  Physical Therapy Evaluation  Patient Details  Name: Shelly Beasley MRN: [id number] Date of Birth: 09-Oct-1961 Referring Provider: Dr. Stark Klein   Encounter Date: 03/02/2018  PT End of Session - 03/02/18 1751    Visit Number  1    Number of Visits  3    Date for PT Re-Evaluation  04/02/18    PT Start Time  1520    PT Stop Time  1555    PT Time Calculation (min)  35 min    Activity Tolerance  Patient tolerated treatment well    Behavior During Therapy  [facility] for tasks assessed/performed       Past Medical History:  Diagnosis Date  . Acid reflux    takes Omeprazole as needed  . Breast cancer ([place])   . Cancer ([place]) 02/2017   left breast cancer  . History of radiation therapy 05/26/17-07/02/17   left breast 50.4 Gy  . Hypertension   . Personal history of radiation therapy     Past Surgical History:  Procedure Laterality Date  . BREAST BIOPSY    . BREAST LUMPECTOMY Left    2018  . BREAST LUMPECTOMY WITH RADIOACTIVE SEED AND SENTINEL LYMPH NODE BIOPSY Left 04/02/2017   Procedure: LEFT BREAST LUMPECTOMY WITH RADIOACTIVE SEED AND SENTINEL LYMPH NODE BIOPSY;  Surgeon: Stark Klein, MD;  Location: [place];  Service: General;  Laterality: Left;  . BUNIONECTOMY Bilateral   . ENDOMETRIAL ABLATION    . RE-EXCISION OF BREAST LUMPECTOMY Left 04/16/2017   Procedure: RE-EXCISION OF BREAST LUMPECTOMY;  Surgeon: Stark Klein, MD;  Location: [place];  Service: General;  Laterality: Left;    There were no vitals filed for this visit.   Subjective Assessment - 03/02/18 1523    Subjective  Dr. Barry Dienes said she saw swelling in the breast part and where I had the surgery at she said it wasn't smooth, it's like going in."    Patient is accompained by:  Interpreter    Pertinent History  Left breast cancer with  lumpectomy 04/02/17 followed by reexcision 04/16/17 to get clear margins.  SLNB at time of first surgery.  Radiation was completed in July 2018.  On tamoxifen. HTN controlled with meds. Otherwise healthy.    Patient Stated Goals  see if we can help the issues identified by Dr. Barry Dienes    Currently in Pain?  No/denies         [facility] PT Assessment - 03/02/18 0001      Assessment   Medical Diagnosis  left breast cancer    Referring Provider  Dr. Stark Klein    Onset Date/Surgical Date  04/16/17    Hand Dominance  Right    Prior Therapy  none      Precautions   Precautions  Other (comment)    Precaution Comments  cancer precautions      Restrictions   Weight Bearing Restrictions  No      Balance Screen   Has the patient fallen in the past 6 months  No    Has the patient had a decrease in activity level because of a fear of falling?   No    Is the patient reluctant to leave their home because of a fear of falling?   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 2 inches long, well-healed      ROM / Strength   AROM / PROM / Strength  AROM      AROM   Overall AROM Comments  both shoulders WFL      Palpation   Palpation comment  tissue is very firm/indurated at area of incision      Ambulation/Gait    Ambulation/Gait  Yes    Ambulation/Gait Assistance  7: Independent        LYMPHEDEMA/ONCOLOGY QUESTIONNAIRE - 03/02/18 1532      Lymphedema Assessments   Lymphedema Assessments  Upper extremities      Right Upper Extremity Lymphedema   10 cm Proximal to Olecranon Process  32.3 cm    Olecranon Process  28.5 cm    10 cm Proximal to Ulnar Styloid Process  24.9 cm    Just Proximal to Ulnar Styloid Process  18 cm    Across Hand at [employer]  22 cm    At [place] of 2nd Digit  7 cm      Left Upper Extremity Lymphedema   10 cm Proximal to Olecranon Process  33.4 cm    Olecranon Process  27.9 cm    10 cm Proximal to Ulnar Styloid Process  24.5 cm    Just Proximal to Ulnar Styloid Process  17.2 cm    Across Hand at [employer]  21 cm    At [place] of 2nd Digit  6.6 cm          Objective measurements completed on examination: See above findings.              PT Education - 03/02/18 1751    Education provided  Yes    Education Details  about features of a good compression bra, where and how to obtain one    Person(s) Educated  Patient    Methods  Explanation;Handout    Comprehension  Verbalized understanding          PT Long Term Goals - 03/02/18 1757      PT LONG TERM GOAL #1   Title  Pt. will be independent in self-manual lymph drainage for left breast.    Time  2    Period  Weeks    Status  New      PT LONG TERM GOAL #2   Title  Pt. will be knowledgeable about appropriate compression bras.    Status  Achieved      PT LONG TERM GOAL #3   Title  Pt. will be knowledgeable about scar/soft tissue mobilization to area around her incision at left breast.    Time  2    Period  Weeks    Status  New  Plan - 03/02/18 1753    Clinical Impression Statement  Pt. appears to have mild breast swelling and/or radiation fibrosis.  She says her swelling is variable, and is less today than on some other days. She has an area near her lumpectomy  scar that is very firm to the touch.     History and Personal Factors relevant to plan of care:  none    Clinical Presentation  Stable    Clinical Decision Making  Low    Rehab Potential  Good    PT Frequency  1x / week    PT Duration  2 weeks    PT Treatment/Interventions  ADLs/Self Care Home Management;DME Instruction;Patient/family education;Manual techniques;Manual lymph drainage;Compression bandaging;Scar mobilization    PT Next Visit Plan  Begin manual lymph drainage for left breast area and teach patient the same. Include instruction in soft tissue mobilization at lumpectomy incision site.  At second session, review and have patient demonstrate manual lymph drainage. Check on whether she obtained appropriate compression bras.    Consulted and Agree with Plan of Care  Patient       Patient will benefit from skilled therapeutic intervention in order to improve the following deficits and impairments:  Increased edema, Decreased knowledge of use of DME  Visit Diagnosis: Lymphedema, not elsewhere classified - Plan: PT plan of care cert/re-cert  Disorder of the skin and subcutaneous tissue related to radiation, unspecified - Plan: PT plan of care cert/re-cert     Problem List Patient Active Problem List   Diagnosis Date Noted  . Malignant neoplasm of upper-outer quadrant of left breast in female, estrogen receptor positive ([place]) 02/03/2017    [place] 03/02/2018, 6:00 PM  [place], [place], [address] Phone: [phone]   Fax:  [phone]  Name: Shelly Beasley MRN: [id number] Date of Birth: 15-Jun-1961  Serafina Royals, PT 03/02/18 6:00 PM

## 2018-03-12 ENCOUNTER — Ambulatory Visit: Payer: BC Managed Care – PPO | Admitting: Physical Therapy

## 2018-03-12 DIAGNOSIS — I890 Lymphedema, not elsewhere classified: Secondary | ICD-10-CM

## 2018-03-12 DIAGNOSIS — L599 Disorder of the skin and subcutaneous tissue related to radiation, unspecified: Secondary | ICD-10-CM

## 2018-03-12 NOTE — Patient Instructions (Signed)
Self manual lymph drainage: Perform this sequence once a day.  Only give enough pressure no your skin to make the skin move.  Diaphragmatic - Supine   Inhale through nose making navel move out toward hands. Exhale through puckered lips, hands follow navel in. Repeat _5__ times. Rest _10__ seconds between repeats.   Copyright  VHI. All rights reserved.  Hug yourself.  Do circles at your neck just above your collarbones.  Repeat this 10 times.  Axilla - One at a Time   Using full weight of flat hand and fingers at center of uninvolved armpit, make _10__ in-place circles.   Copyright  VHI. All rights reserved.  LEG: Inguinal Nodes Stimulation   With small finger side of hand against hip crease on involved side, gently perform circles at the crease. Repeat __10_ times.   Copyright  VHI. All rights reserved.  1) Axilla to Inguinal Nodes - Pump   On involved side, pump _4__ times from armpit along side of trunk to hip crease.  Now gently stretch skin from the involved side to the uninvolved side across the chest at the shoulder line.  Repeat that 4 times.  Draw an imaginary diagonal line from upper outer breast through the nipple area toward lower inner breast.  Direct fluid upward and inward from this line toward the pathway across your upper chest .  Do this in three rows to treat all of the upper inner breast tissue, and do each row 3-4x.      Direct fluid to treat all of lower outer breast tissue downward and outward toward      pathway that is aimed at the left groin.  Finish by doing the pathways as described above going from your involved armpit to the same side groin and going across your upper chest from the involved shoulder to the uninvolved shoulder.  Repeat the steps above where you do circles in your left groin and right armpit. Copyright  VHI. All rights reserved.  

## 2018-03-12 NOTE — Therapy (Signed)
[place], [place], [address] Phone: [phone]   Fax:  [phone]  Physical Therapy Treatment  Patient Details  Name: Shelly Beasley MRN: [id number] Date of Birth: February 01, 1961 Referring Provider: Dr. Stark Klein   Encounter Date: 03/12/2018  PT End of Session - 03/12/18 1312    Visit Number  2    Number of Visits  3    Date for PT Re-Evaluation  04/02/18    PT Start Time  0800    PT Stop Time  0845    PT Time Calculation (min)  45 min    Activity Tolerance  Patient tolerated treatment well    Behavior During Therapy  [facility] for tasks assessed/performed       Past Medical History:  Diagnosis Date  . Acid reflux    takes Omeprazole as needed  . Breast cancer ([place])   . Cancer ([place]) 02/2017   left breast cancer  . History of radiation therapy 05/26/17-07/02/17   left breast 50.4 Gy  . Hypertension   . Personal history of radiation therapy     Past Surgical History:  Procedure Laterality Date  . BREAST BIOPSY    . BREAST LUMPECTOMY Left    2018  . BREAST LUMPECTOMY WITH RADIOACTIVE SEED AND SENTINEL LYMPH NODE BIOPSY Left 04/02/2017   Procedure: LEFT BREAST LUMPECTOMY WITH RADIOACTIVE SEED AND SENTINEL LYMPH NODE BIOPSY;  Surgeon: Stark Klein, MD;  Location: [place];  Service: General;  Laterality: Left;  . BUNIONECTOMY Bilateral   . ENDOMETRIAL ABLATION    . RE-EXCISION OF BREAST LUMPECTOMY Left 04/16/2017   Procedure: RE-EXCISION OF BREAST LUMPECTOMY;  Surgeon: Stark Klein, MD;  Location: [place];  Service: General;  Laterality: Left;    There were no vitals filed for this visit.  Subjective Assessment - 03/12/18 0801    Subjective  "I'm fine.  Today's not a bad day."    Pertinent History  Left breast cancer with lumpectomy 04/02/17 followed by reexcision 04/16/17 to get clear margins.  SLNB at time of first surgery.  Radiation was completed in July 2018.   On tamoxifen. HTN controlled with meds. Otherwise healthy.    Currently in Pain?  No/denies                No data recorded       [facility] Adult PT Treatment/Exercise - 03/12/18 0001      Self-Care   Self-Care  Other Self-Care Comments    Other Self-Care Comments   fashioned and issued foam chip pack for patient to put in her bra at medial aspect of left breast.where she has significant induration      Manual Therapy   Manual Therapy  Manual Lymphatic Drainage (MLD);Edema management    Manual Lymphatic Drainage (MLD)  Instructed patient in diaphragmatic breathing, then performed the following: short neck, right axilla and anterior interaxillary anastomosis, left groin and axillo- inguinal anastomosis, and left breast, directing towards pathways.  Then instructed patient in performing this herself.             PT Education - 03/12/18 1311    Education provided  Yes    Education Details  self-manual lymph drainage for left breast    Person(s) Educated  Patient    Methods  Explanation;Demonstration;Tactile cues;Verbal cues;Handout    Comprehension  Verbalized understanding;Need further instruction          PT Long Term Goals - 03/02/18 1757  PT LONG TERM GOAL #1   Title  Pt. will be independent in self-manual lymph drainage for left breast.    Time  2    Period  Weeks    Status  New      PT LONG TERM GOAL #2   Title  Pt. will be knowledgeable about appropriate compression bras.    Status  Achieved      PT LONG TERM GOAL #3   Title  Pt. will be knowledgeable about scar/soft tissue mobilization to area around her incision at left breast.    Time  2    Period  Weeks    Status  New            Plan - 03/12/18 1312    Clinical Impression Statement  Pt. did well with initial instruction today in self-manual lymph drainage.  Also issued foam chip pack to help soften indurated tissue at medial left breast.     Rehab Potential  Good    PT Frequency   1x / week    PT Duration  2 weeks    PT Treatment/Interventions  ADLs/Self Care Home Management;DME Instruction;Patient/family education;Manual techniques;Manual lymph drainage;Compression bandaging;Scar mobilization    PT Next Visit Plan    Review and have patient demonstrate manual lymph drainage. Include instruction in soft tissue mobilization at lumpectomy incision site.  Check on whether she obtained appropriate compression bras.    PT Home Exercise Plan  self-manual lymph drainage    Consulted and Agree with Plan of Care  Patient       Patient will benefit from skilled therapeutic intervention in order to improve the following deficits and impairments:  Increased edema, Decreased knowledge of use of DME  Visit Diagnosis: Lymphedema, not elsewhere classified  Disorder of the skin and subcutaneous tissue related to radiation, unspecified     Problem List Patient Active Problem List   Diagnosis Date Noted  . Malignant neoplasm of upper-outer quadrant of left breast in female, estrogen receptor positive ([place]) 02/03/2017    [place] 03/12/2018, 1:15 PM  [place], [place], [address] Phone: [phone]   Fax:  [phone]  Name: Shelly Beasley MRN: [id number] Date of Birth: [birth year]  Serafina Royals, PT 03/12/18 1:15 PM

## 2018-03-26 ENCOUNTER — Ambulatory Visit: Payer: BC Managed Care – PPO | Attending: General Surgery | Admitting: Physical Therapy

## 2018-03-26 DIAGNOSIS — Z170 Estrogen receptor positive status [ER+]: Secondary | ICD-10-CM | POA: Insufficient documentation

## 2018-03-26 DIAGNOSIS — L599 Disorder of the skin and subcutaneous tissue related to radiation, unspecified: Secondary | ICD-10-CM | POA: Diagnosis present

## 2018-03-26 DIAGNOSIS — I890 Lymphedema, not elsewhere classified: Secondary | ICD-10-CM

## 2018-03-26 DIAGNOSIS — C50412 Malignant neoplasm of upper-outer quadrant of left female breast: Secondary | ICD-10-CM | POA: Insufficient documentation

## 2018-03-26 NOTE — Therapy (Signed)
[place], [place], [address] Phone: [phone]   Fax:  [phone]  Physical Therapy Treatment  Patient Details  Name: Shelly Beasley MRN: [id number] Date of Birth: Feb 24, 1961 Referring Provider: Dr. Stark Klein   Encounter Date: 03/26/2018  PT End of Session - 03/26/18 1222    Visit Number  3    Number of Visits  3    Date for PT Re-Evaluation  04/02/18    PT Start Time  0806    PT Stop Time  0847    PT Time Calculation (min)  41 min    Activity Tolerance  Patient tolerated treatment well    Behavior During Therapy  [facility] for tasks assessed/performed       Past Medical History:  Diagnosis Date  . Acid reflux    takes Omeprazole as needed  . Breast cancer ([place])   . Cancer ([place]) 02/2017   left breast cancer  . History of radiation therapy 05/26/17-07/02/17   left breast 50.4 Gy  . Hypertension   . Personal history of radiation therapy     Past Surgical History:  Procedure Laterality Date  . BREAST BIOPSY    . BREAST LUMPECTOMY Left    2018  . BREAST LUMPECTOMY WITH RADIOACTIVE SEED AND SENTINEL LYMPH NODE BIOPSY Left 04/02/2017   Procedure: LEFT BREAST LUMPECTOMY WITH RADIOACTIVE SEED AND SENTINEL LYMPH NODE BIOPSY;  Surgeon: Stark Klein, MD;  Location: [place];  Service: General;  Laterality: Left;  . BUNIONECTOMY Bilateral   . ENDOMETRIAL ABLATION    . RE-EXCISION OF BREAST LUMPECTOMY Left 04/16/2017   Procedure: RE-EXCISION OF BREAST LUMPECTOMY;  Surgeon: Stark Klein, MD;  Location: [place];  Service: General;  Laterality: Left;    There were no vitals filed for this visit.  Subjective Assessment - 03/26/18 0808    Subjective  I guess it went well (the manual lymph drainage). Hasn't got compression bras yet.  Only wore foam chip packs a couple days, but did see some indentations from that.    Pertinent History  Left breast cancer with lumpectomy  04/02/17 followed by reexcision 04/16/17 to get clear margins.  SLNB at time of first surgery.  Radiation was completed in July 2018.  On tamoxifen. HTN controlled with meds. Otherwise healthy.    Currently in Pain?  No/denies                       [facility] Adult PT Treatment/Exercise - 03/26/18 0001      Self-Care   Other Self-Care Comments   instructed patient in scar mobilization for left upper outer breast incision, particularly toward the left shoulder, the direction in which it is more stuck      Manual Therapy   Manual Lymphatic Drainage (MLD)  Instructed patient in diaphragmatic breathing, then performed the following: short neck, right axilla and anterior interaxillary anastomosis, left groin and axillo- inguinal anastomosis, and left breast, directing towards pathways.  Then instructed patient in performing this herself.             PT Education - 03/26/18 1221    Education provided  Yes    Education Details  scar mobilization, reviewed self-manual lymph drainage    Person(s) Educated  Patient    Methods  Explanation    Comprehension  Verbalized understanding          PT Long Term Goals - 03/26/18 7893  PT LONG TERM GOAL #1   Title  Pt. will be independent in self-manual lymph drainage for left breast.    Status  Achieved      PT LONG TERM GOAL #2   Title  Pt. will be knowledgeable about appropriate compression bras.    Status  Achieved      PT LONG TERM GOAL #3   Title  Pt. will be knowledgeable about scar/soft tissue mobilization to area around her incision at left breast.    Status  Achieved            Plan - 03/26/18 1222    Clinical Impression Statement  Pt. did very well with performing self-manual lymph drainage today; also learned scar mobilization, and has met all goals. She has not gotten compression bras yet but knows how to do that.    Rehab Potential  Good    PT Treatment/Interventions  ADLs/Self Care Home Management;DME  Instruction;Patient/family education;Manual techniques;Manual lymph drainage;Compression bandaging;Scar mobilization    PT Next Visit Plan  Discharge.    PT Home Exercise Plan  self-manual lymph drainage, scar mobilization    Consulted and Agree with Plan of Care  Patient       Patient will benefit from skilled therapeutic intervention in order to improve the following deficits and impairments:  Increased edema, Decreased knowledge of use of DME  Visit Diagnosis: Lymphedema, not elsewhere classified  Disorder of the skin and subcutaneous tissue related to radiation, unspecified     Problem List Patient Active Problem List   Diagnosis Date Noted  . Malignant neoplasm of upper-outer quadrant of left breast in female, estrogen receptor positive ([place]) 02/03/2017    [place] 03/26/2018, 12:24 PM  [place], [place], [address] Phone: [phone]   Fax:  [phone]  Name: Shelly Beasley MRN: [id number] Date of Birth: [birth year]  PHYSICAL THERAPY DISCHARGE SUMMARY  Visits from Start of Care: 3  Current functional level related to goals / functional outcomes: Goals met as noted above.   Remaining deficits: none   Education / Equipment: Self-manual lymph drainage, scar mobilization. Plan: Patient agrees to discharge.  Patient goals were met. Patient is being discharged due to meeting the stated rehab goals.  ?????    Serafina Royals, PT 03/26/18 12:25 PM

## 2018-05-05 ENCOUNTER — Inpatient Hospital Stay: Payer: BC Managed Care – PPO | Attending: Oncology | Admitting: Oncology

## 2018-05-05 ENCOUNTER — Inpatient Hospital Stay: Payer: BC Managed Care – PPO

## 2018-05-06 ENCOUNTER — Telehealth: Payer: Self-pay | Admitting: Oncology

## 2018-05-06 NOTE — Telephone Encounter (Signed)
Patient called to reschedule  °

## 2018-05-17 ENCOUNTER — Encounter: Payer: Self-pay | Admitting: Adult Health

## 2018-05-17 ENCOUNTER — Inpatient Hospital Stay (HOSPITAL_BASED_OUTPATIENT_CLINIC_OR_DEPARTMENT_OTHER): Payer: BC Managed Care – PPO | Admitting: Adult Health

## 2018-05-17 ENCOUNTER — Inpatient Hospital Stay: Payer: BC Managed Care – PPO | Attending: Oncology

## 2018-05-17 ENCOUNTER — Telehealth: Payer: Self-pay | Admitting: Oncology

## 2018-05-17 VITALS — BP 118/74 | HR 83 | Temp 98.6°F | Resp 18 | Ht 65.0 in | Wt 189.8 lb

## 2018-05-17 DIAGNOSIS — I10 Essential (primary) hypertension: Secondary | ICD-10-CM

## 2018-05-17 DIAGNOSIS — Z170 Estrogen receptor positive status [ER+]: Secondary | ICD-10-CM | POA: Insufficient documentation

## 2018-05-17 DIAGNOSIS — Z79899 Other long term (current) drug therapy: Secondary | ICD-10-CM | POA: Diagnosis not present

## 2018-05-17 DIAGNOSIS — Z923 Personal history of irradiation: Secondary | ICD-10-CM | POA: Diagnosis not present

## 2018-05-17 DIAGNOSIS — Z807 Family history of other malignant neoplasms of lymphoid, hematopoietic and related tissues: Secondary | ICD-10-CM | POA: Diagnosis not present

## 2018-05-17 DIAGNOSIS — Z79818 Long term (current) use of other agents affecting estrogen receptors and estrogen levels: Secondary | ICD-10-CM | POA: Insufficient documentation

## 2018-05-17 DIAGNOSIS — C50412 Malignant neoplasm of upper-outer quadrant of left female breast: Secondary | ICD-10-CM | POA: Diagnosis not present

## 2018-05-17 DIAGNOSIS — K219 Gastro-esophageal reflux disease without esophagitis: Secondary | ICD-10-CM

## 2018-05-17 LAB — CBC WITH DIFFERENTIAL/PLATELET
BASOS PCT: 1 %
Basophils Absolute: 0 10*3/uL (ref 0.0–0.1)
EOS ABS: 0.2 10*3/uL (ref 0.0–0.5)
Eosinophils Relative: 5 %
HEMATOCRIT: 38.5 % (ref 34.8–46.6)
HEMOGLOBIN: 12.6 g/dL (ref 11.6–15.9)
LYMPHS ABS: 1.5 10*3/uL (ref 0.9–3.3)
Lymphocytes Relative: 42 %
MCH: 27.8 pg (ref 25.1–34.0)
MCHC: 32.8 g/dL (ref 31.5–36.0)
MCV: 85 fL (ref 79.5–101.0)
MONOS PCT: 9 %
Monocytes Absolute: 0.3 10*3/uL (ref 0.1–0.9)
NEUTROS PCT: 43 %
Neutro Abs: 1.6 10*3/uL (ref 1.5–6.5)
Platelets: 179 10*3/uL (ref 145–400)
RBC: 4.53 MIL/uL (ref 3.70–5.45)
RDW: 13.8 % (ref 11.2–14.5)
WBC: 3.6 10*3/uL — AB (ref 3.9–10.3)

## 2018-05-17 LAB — COMPREHENSIVE METABOLIC PANEL
ALBUMIN: 3.6 g/dL (ref 3.5–5.0)
ALK PHOS: 67 U/L (ref 40–150)
ALT: 13 U/L (ref 0–55)
AST: 18 U/L (ref 5–34)
Anion gap: 10 (ref 3–11)
BUN: 16 mg/dL (ref 7–26)
CALCIUM: 9.7 mg/dL (ref 8.4–10.4)
CO2: 27 mmol/L (ref 22–29)
CREATININE: 0.94 mg/dL (ref 0.60–1.10)
Chloride: 104 mmol/L (ref 98–109)
GFR calc Af Amer: >60 mL/min (ref >60)
GFR calc non Af Amer: >60 mL/min (ref >60)
GLUCOSE: 118 mg/dL (ref 70–140)
Potassium: 3.6 mmol/L (ref 3.5–5.1)
SODIUM: 141 mmol/L (ref 136–145)
Total Bilirubin: 0.3 mg/dL (ref 0.2–1.2)
Total Protein: 6.6 g/dL (ref 6.4–8.3)

## 2018-05-17 NOTE — Progress Notes (Signed)
Shelly Beasley  [phone] [phone]     ID: Shelly Beasley DOB: September 07, 1961  MR#: [id number]  YDX#:[id number]  Patient Care Team: Harlan Stains, MD as PCP - General (Family Medicine) Stark Klein, MD as Consulting Physician (General Surgery) Magrinat, [name], MD as Consulting Physician (Oncology) [name], MD as Consulting Physician (Radiation Oncology) [name], MD as Consulting Physician (Orthopedic Surgery) [name], [name], NP as Nurse Practitioner (Hematology and Oncology) OTHER MD:  CHIEF COMPLAINT: Estrogen receptor positive breast cancer  CURRENT TREATMENT: Tamoxifen  BREAST CANCER HISTORY: From the original intake note:  Shelly Beasley had routine bilateral screening mammography with tomography at the [facility] 01/16/2017. This showed a possible mass in the left breast. On 01/23/2017 she underwent left diagnostic mammography with tomography and ultrasonography. The breast density was category C. In the lateral posterior breast, there was an irregular 1 cm mass which by ultrasound was located at 8 cm from the nipple at the 1:30 o'clock position. This was irregular, hypoechoic, and measured 1.0 cm. Ultrasound of the left axilla was sonographically benign.  On favorite 12th 2018 [place] underwent biopsy of the left breast mass in question, and this showed ([id number]) invasive lobular carcinoma, E-cadherin negative, grade 1, estrogen receptor 100% positive, progesterone receptor 100% positive, both with strong staining intensity, with an MIB-1 of 70%, and HER-2 nonamplified, with a signals ratio of 1.24 and the number per cell 2.10.  Her subsequent history is as detailed below  INTERVAL HISTORY: Shelly Beasley is here today for follow up of her estrogen positive breast cancer.  She is taking Tamoxifen daily and is tolerating this well.  She does experience hot flashes that she isn't sure the frequency, because she doesn't track them.  She says  these are manageable and she tolerates these without needing any intervention.  Shelly Beasley is tolerating the Tamoxifen quite well.  She is seeing GYN regularly.  Her LMP was several years ago, because she had an ablation, and she hasn't had cycles since.    REVIEW OF SYSTEMS: Shelly Beasley is doing well otherwise.  She has occasional tenderness at her surgical site.  Otherwise, she denies any issues.  She is without headaches and vision changes.  She denies any shortness of breath, cough, chest pain, or palpitations.  She does not have any nausea/vomiting.  She takes a stool softener and tart cherry juice to help with occasional constipation that she may experience.    She is up to date with her health maintenance including her annual visit to her PCP, and her routine cancer screenings.    PAST MEDICAL HISTORY: Past Medical History:  Diagnosis Date  . Acid reflux    takes Omeprazole as needed  . Breast cancer ([place])   . Cancer ([place]) 02/2017   left breast cancer  . History of radiation therapy 05/26/17-07/02/17   left breast 50.4 Gy  . Hypertension   . Personal history of radiation therapy     PAST SURGICAL HISTORY: Past Surgical History:  Procedure Laterality Date  . BREAST BIOPSY    . BREAST LUMPECTOMY Left    2018  . BREAST LUMPECTOMY WITH RADIOACTIVE SEED AND SENTINEL LYMPH NODE BIOPSY Left 04/02/2017   Procedure: LEFT BREAST LUMPECTOMY WITH RADIOACTIVE SEED AND SENTINEL LYMPH NODE BIOPSY;  Surgeon: Stark Klein, MD;  Location: [place];  Service: General;  Laterality: Left;  . BUNIONECTOMY Bilateral   . ENDOMETRIAL ABLATION    . RE-EXCISION OF BREAST LUMPECTOMY Left 04/16/2017   Procedure:  RE-EXCISION OF BREAST LUMPECTOMY;  Surgeon: Stark Klein, MD;  Location: [place];  Service: General;  Laterality: Left;    FAMILY HISTORY Family History  Problem Relation Age of Onset  . Multiple myeloma Mother   . Lymphoma Father   The patient's father died from lymphoma  at the age of 34. The patient's mother died from myeloma at the age of 18. The patient had 5 brothers, 3 sisters. There is no history of breast or ovarian cancer in the family.  GYNECOLOGIC HISTORY:  No LMP recorded. Patient has had an ablation. Menarche age 9, first live birth age 51, the patient is Shelly Beasley. She stopped having periods approximately 2013. She did not take hormone replacement. She used oral contraceptives for approximately 2 years remotely, with no complications.  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 Her husband Shelly Beasley is a Glass blower/designer. Their daughter Shelly Beasley works in [place] as a Geophysicist/field seismologist. Son Shelly Beasley works in Biomedical scientist also in [place]. Daughter Shelly Beasley is a Electronics engineer hoping to become an Chief Technology Officer. The patient has no grandchildren. She attends the [employer]   ADVANCED DIRECTIVES: Not in place   HEALTH MAINTENANCE: Social History   Tobacco Use  . Smoking status: Never Smoker  . Smokeless tobacco: Never Used  Substance Use Topics  . Alcohol use: Yes    Comment: social  . Drug use: No     Colonoscopy: 2014  PAP: 2014  Bone density: Never   Allergies  Allergen Reactions  . Codeine Nausea And Vomiting    Current Outpatient Medications  Medication Sig Dispense Refill  . olmesartan-hydrochlorothiazide (BENICAR HCT) 40-12.5 MG per tablet Take 1 tablet by mouth daily.    Marland Kitchen omeprazole (PRILOSEC) 40 MG capsule Take 40 mg by mouth as needed.     . senna (SENOKOT) 8.6 MG tablet Take 1 tablet by mouth as needed for constipation.    . tamoxifen (NOLVADEX) 20 MG tablet      No current facility-administered medications for this visit.     OBJECTIVE:  Vitals:   05/17/18 0850  BP: 118/74  Pulse: 83  Resp: 18  Temp: 98.6 F (37 C)  SpO2: 98%     Body mass index is 31.58 kg/m.    ECOG FS:1 GENERAL: Patient is a well appearing female in no acute  distress HEENT:  Sclerae anicteric.  Oropharynx clear and moist. No ulcerations or evidence of oropharyngeal candidiasis. Neck is supple.  NODES:  No cervical, supraclavicular, or axillary lymphadenopathy palpated.  BREAST EXAM:  Left breast s/p lumpectomy and radiation, no nodules, masses noted, right breast without nodules, masses, skin or nipple changes.   LUNGS:  Clear to auscultation bilaterally.  No wheezes or rhonchi. HEART:  Regular rate and rhythm. No murmur appreciated. ABDOMEN:  Soft, nontender.  Positive, normoactive bowel sounds. No organomegaly palpated. MSK:  No focal spinal tenderness to palpation. Full range of motion bilaterally in the upper extremities. EXTREMITIES:  No peripheral edema.   SKIN:  Clear with no obvious rashes or skin changes. No nail dyscrasia. NEURO:  Nonfocal. Well oriented.  Appropriate affect.    LAB RESULTS:  CMP     Component Value Date/Time   NA 140 11/03/2017 1117   K 3.4 (L) 11/03/2017 1117   CL 101 03/26/2017 1204   CO2 28 11/03/2017 1117   GLUCOSE 101 11/03/2017 1117   BUN 14.7 11/03/2017 1117   CREATININE 0.9 11/03/2017  1117   CALCIUM 9.3 11/03/2017 1117   PROT 6.9 11/03/2017 1117   ALBUMIN 3.6 11/03/2017 1117   AST 18 11/03/2017 1117   ALT 14 11/03/2017 1117   ALKPHOS 70 11/03/2017 1117   BILITOT 0.37 11/03/2017 1117   GFRNONAA >60 03/26/2017 1204   GFRAA >60 03/26/2017 1204    INo results found for: SPEP, UPEP  Lab Results  Component Value Date   WBC 3.6 (L) 05/17/2018   NEUTROABS 1.6 05/17/2018   HGB 12.6 05/17/2018   HCT 38.5 05/17/2018   MCV 85.0 05/17/2018   PLT 179 05/17/2018      Chemistry      Component Value Date/Time   NA 140 11/03/2017 1117   K 3.4 (L) 11/03/2017 1117   CL 101 03/26/2017 1204   CO2 28 11/03/2017 1117   BUN 14.7 11/03/2017 1117   CREATININE 0.9 11/03/2017 1117      Component Value Date/Time   CALCIUM 9.3 11/03/2017 1117   ALKPHOS 70 11/03/2017 1117   AST 18 11/03/2017 1117   ALT  14 11/03/2017 1117   BILITOT 0.37 11/03/2017 1117       No results found for: LABCA2  No components found for: LABCA125  No results for input(s): INR in the last 168 hours.  Urinalysis    Component Value Date/Time   COLORURINE YELLOW 12/09/2015 2237   APPEARANCEUR CLEAR 12/09/2015 2237   LABSPEC 1.024 12/09/2015 2237   PHURINE 5.5 12/09/2015 2237   GLUCOSEU NEGATIVE 12/09/2015 2237   HGBUR NEGATIVE 12/09/2015 2237   BILIRUBINUR NEGATIVE 12/09/2015 2237   KETONESUR NEGATIVE 12/09/2015 2237   PROTEINUR NEGATIVE 12/09/2015 2237   NITRITE NEGATIVE 12/09/2015 2237   LEUKOCYTESUR NEGATIVE 12/09/2015 2237     STUDIES:   ELIGIBLE FOR AVAILABLE RESEARCH PROTOCOL: no  ASSESSMENT: 57 y.o. [place] woman status post left breast upper outer quadrant biopsy 01/26/2017, for a clinical T1BN0, stage IA invasive lobular carcinoma, grade 1, E-cadherin negative, estrogen and progesterone receptor positive, HER-2 not amplified, with an MIB-1 of 70%.   (1) Status post left lumpectomy and sentinel lymph node sampling 04/02/2017 for a pT1c pN0, stage IA invasive lobular carcinoma, grade 2, with close margins, which were improved with additional surgery 04/16/2017 no residual carcinoma)  (2) Oncotype DX score of 12 predicts a 10 year risk of outside the breast recurrence of 8% if the patient's only systemic treatment is tamoxifen for 5 years. It also vertex no benefit from chemotherapy  (3) adjuvant radiation completed 07/02/2017  (4) started tamoxifen 08/15/2017  PLAN: Arrionna is doing well today.  She is taking Tamoxifen daily and is tolerating it well.  She continues to manage hot flashes without any medication intervention.  Kamoni is clinically and radiographically without any signs of recurrence of her breast cancer.  Her last mammogram on 01/25/2018 was normal.    I continued to counsel Jessabelle on keeping up with her health maintenance in regards to colon cancer screenings and an annual  physical.  I recommended healthy diet, and exercise 30 minutes most days of the week.    She will return in 6 months for labs and f/u with Dr. Jana Hakim.  At that point, he may very well go to seeing her on an annual follow up basis.She knows to call for any issues that may develop before the next visit.  A total of (30) minutes of face-to-face time was spent with this patient with greater than 50% of that time in counseling and care-coordination.   Mendel Ryder  [name], NP  05/17/18 9:03 AM Medical Oncology and Hematology [facility] [address] [place], [place] [address] Tel. [phone]    Fax. [phone]  .

## 2018-05-17 NOTE — Telephone Encounter (Signed)
Patient decline avs and calendar °

## 2018-07-29 ENCOUNTER — Other Ambulatory Visit: Payer: Self-pay | Admitting: Oncology

## 2018-10-12 ENCOUNTER — Other Ambulatory Visit: Payer: Self-pay | Admitting: Oncology

## 2018-11-14 NOTE — Progress Notes (Signed)
[place]  Telephone:([phone] Fax:(336) [phone]     ID: Shelly Beasley DOB: 1960/12/26  MR#: [id number]  JQB#:[id number]  Patient Care Team: Harlan Stains, MD as PCP - General (Family Medicine) Stark Klein, MD as Consulting Physician (General Surgery) Magrinat, [name], MD as Consulting Physician (Oncology) [name], MD as Consulting Physician (Radiation Oncology) [name], MD as Consulting Physician (Orthopedic Surgery) [name], [name], NP as Nurse Practitioner (Hematology and Oncology) OTHER MD:   CHIEF COMPLAINT: Estrogen receptor positive breast cancer  CURRENT TREATMENT: Tamoxifen  BREAST CANCER HISTORY: From the original intake note:  Shelly Beasley had routine bilateral screening mammography with tomography at the [facility] 01/16/2017. This showed a possible mass in the left breast. On 01/23/2017 she underwent left diagnostic mammography with tomography and ultrasonography. The breast density was category C. In the lateral posterior breast, there was an irregular 1 cm mass which by ultrasound was located at 8 cm from the nipple at the 1:30 o'clock position. This was irregular, hypoechoic, and measured 1.0 cm. Ultrasound of the left axilla was sonographically benign.  On favorite 12th 2018 Shelly Beasley underwent biopsy of the left breast mass in question, and this showed ([id number]) invasive lobular carcinoma, E-cadherin negative, grade 1, estrogen receptor 100% positive, progesterone receptor 100% positive, both with strong staining intensity, with an MIB-1 of 70%, and HER-2 nonamplified, with a signals ratio of 1.24 and the number per cell 2.10.  Her subsequent history is as detailed below  INTERVAL HISTORY: Shelly Beasley returns today for follow-up of her estrogen positive breast cancer.   The patient continues on Tamoxifen, which she is tolerating well. She is experiencing hot flashes on and off. She experiences mild vaginal wetness.    Her most  recent mammogram, with tomography, was 01/25/2018 at the [facility].  It showed the breast density to be category C.  There was no evidence of malignancy.   REVIEW OF SYSTEMS: Marvell is doing well overall. For Thanksgiving, she went to the beach.  She normally exercises by doing Zumba and walking but has "fallen off" recently.  The patient denies unusual headaches, visual changes, nausea, vomiting, or dizziness. There has been no unusual cough, phlegm production, or pleurisy. This been no change in bowel or bladder habits. The patient denies unexplained fatigue or unexplained weight loss, bleeding, rash, or fever. A detailed review of systems was otherwise noncontributory.    PAST MEDICAL HISTORY: Past Medical History:  Diagnosis Date  . Acid reflux    takes Omeprazole as needed  . Breast cancer ([place])   . Cancer ([place]) 02/2017   left breast cancer  . History of radiation therapy 05/26/17-07/02/17   left breast 50.4 Gy  . Hypertension   . Personal history of radiation therapy     PAST SURGICAL HISTORY: Past Surgical History:  Procedure Laterality Date  . BREAST BIOPSY    . BREAST LUMPECTOMY Left    2018  . BREAST LUMPECTOMY WITH RADIOACTIVE SEED AND SENTINEL LYMPH NODE BIOPSY Left 04/02/2017   Procedure: LEFT BREAST LUMPECTOMY WITH RADIOACTIVE SEED AND SENTINEL LYMPH NODE BIOPSY;  Surgeon: Stark Klein, MD;  Location: [place];  Service: General;  Laterality: Left;  . BUNIONECTOMY Bilateral   . ENDOMETRIAL ABLATION    . RE-EXCISION OF BREAST LUMPECTOMY Left 04/16/2017   Procedure: RE-EXCISION OF BREAST LUMPECTOMY;  Surgeon: Stark Klein, MD;  Location: [place];  Service: General;  Laterality: Left;    FAMILY HISTORY Family History  Problem Relation  Age of Onset  . Multiple myeloma Mother   . Lymphoma Father   The patient's father died from lymphoma at the age of 9. The patient's mother died from myeloma at the age of 30. The patient had 5  brothers, 3 sisters. There is no history of breast or ovarian cancer in the family.  GYNECOLOGIC HISTORY:  No LMP recorded. Patient has had an ablation. Menarche age 57, first live birth age 68, the patient is [place] P3. She stopped having periods approximately 2013. She did not take hormone replacement. She used oral contraceptives for approximately 2 years remotely, with no complications.  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 Her husband [name] is a Glass blower/designer. Their daughter Shelly Beasley works in [place] as a Geophysicist/field seismologist. Son Shelly Beasley works in Biomedical scientist also in [place]. Daughter Shelly Beasley is a Electronics engineer hoping to become an Chief Technology Officer. The patient has no grandchildren. She attends the [employer]   ADVANCED DIRECTIVES: Not in place   HEALTH MAINTENANCE: Social History   Tobacco Use  . Smoking status: Never Smoker  . Smokeless tobacco: Never Used  Substance Use Topics  . Alcohol use: Yes    Comment: social  . Drug use: No     Colonoscopy: 2014  PAP: 2014  Bone density: Never   Allergies  Allergen Reactions  . Codeine Nausea And Vomiting    Current Outpatient Medications  Medication Sig Dispense Refill  . olmesartan-hydrochlorothiazide (BENICAR HCT) 40-12.5 MG per tablet Take 1 tablet by mouth daily.    Marland Kitchen omeprazole (PRILOSEC) 40 MG capsule Take 40 mg by mouth as needed.     . senna (SENOKOT) 8.6 MG tablet Take 1 tablet by mouth as needed for constipation.    . tamoxifen (NOLVADEX) 20 MG tablet Take 1 tablet (20 mg total) by mouth daily. 90 tablet 4   No current facility-administered medications for this visit.     OBJECTIVE: Middle-aged African-American woman who appears younger than stated age  78:   11/16/18 1040  BP: 140/89  Pulse: 78  Resp: 18  Temp: 98.3 F (36.8 C)  SpO2: 100%     Body mass index is 31.4 kg/m.    ECOG FS:0  Sclerae unicteric, EOMs  intact Oropharynx clear and moist No cervical or supraclavicular adenopathy Lungs no rales or rhonchi Heart regular rate and rhythm Abd soft, nontender, positive bowel sounds MSK no focal spinal tenderness, no upper extremity lymphedema Neuro: nonfocal, well oriented, appropriate affect Breasts: The right breast is unremarkable.  The left breast is status post lumpectomy and radiation.  There is no evidence of local recurrence.  Both axillae are benign.   LAB RESULTS:  CMP     Component Value Date/Time   NA 141 05/17/2018 0837   NA 140 11/03/2017 1117   K 3.6 05/17/2018 0837   K 3.4 (L) 11/03/2017 1117   CL 104 05/17/2018 0837   CO2 27 05/17/2018 0837   CO2 28 11/03/2017 1117   GLUCOSE 118 05/17/2018 0837   GLUCOSE 101 11/03/2017 1117   BUN 16 05/17/2018 0837   BUN 14.7 11/03/2017 1117   CREATININE 0.94 05/17/2018 0837   CREATININE 0.9 11/03/2017 1117   CALCIUM 9.7 05/17/2018 0837   CALCIUM 9.3 11/03/2017 1117   PROT 6.6 05/17/2018 0837   PROT 6.9 11/03/2017 1117   ALBUMIN 3.6 05/17/2018 0837   ALBUMIN 3.6 11/03/2017 1117   AST 18 05/17/2018 0837   AST  18 11/03/2017 1117   ALT 13 05/17/2018 0837   ALT 14 11/03/2017 1117   ALKPHOS 67 05/17/2018 0837   ALKPHOS 70 11/03/2017 1117   BILITOT 0.3 05/17/2018 0837   BILITOT 0.37 11/03/2017 1117   GFRNONAA >60 05/17/2018 0837   GFRAA >60 05/17/2018 0837    INo results found for: SPEP, UPEP  Lab Results  Component Value Date   WBC 3.6 (L) 11/16/2018   NEUTROABS 1.3 (L) 11/16/2018   HGB 12.4 11/16/2018   HCT 39.2 11/16/2018   MCV 85.6 11/16/2018   PLT 197 11/16/2018      Chemistry      Component Value Date/Time   NA 141 05/17/2018 0837   NA 140 11/03/2017 1117   K 3.6 05/17/2018 0837   K 3.4 (L) 11/03/2017 1117   CL 104 05/17/2018 0837   CO2 27 05/17/2018 0837   CO2 28 11/03/2017 1117   BUN 16 05/17/2018 0837   BUN 14.7 11/03/2017 1117   CREATININE 0.94 05/17/2018 0837   CREATININE 0.9 11/03/2017 1117       Component Value Date/Time   CALCIUM 9.7 05/17/2018 0837   CALCIUM 9.3 11/03/2017 1117   ALKPHOS 67 05/17/2018 0837   ALKPHOS 70 11/03/2017 1117   AST 18 05/17/2018 0837   AST 18 11/03/2017 1117   ALT 13 05/17/2018 0837   ALT 14 11/03/2017 1117   BILITOT 0.3 05/17/2018 0837   BILITOT 0.37 11/03/2017 1117       No results found for: LABCA2  No components found for: LABCA125  No results for input(s): INR in the last 168 hours.  Urinalysis    Component Value Date/Time   COLORURINE YELLOW 12/09/2015 2237   APPEARANCEUR CLEAR 12/09/2015 2237   LABSPEC 1.024 12/09/2015 2237   PHURINE 5.5 12/09/2015 2237   GLUCOSEU NEGATIVE 12/09/2015 2237   HGBUR NEGATIVE 12/09/2015 2237   BILIRUBINUR NEGATIVE 12/09/2015 2237   KETONESUR NEGATIVE 12/09/2015 2237   PROTEINUR NEGATIVE 12/09/2015 2237   NITRITE NEGATIVE 12/09/2015 2237   LEUKOCYTESUR NEGATIVE 12/09/2015 2237     STUDIES: No results found.  ELIGIBLE FOR AVAILABLE RESEARCH PROTOCOL: no  ASSESSMENT: 57 y.o. [place] woman status post left breast upper outer quadrant biopsy 01/26/2017, for a clinical T1BN0, stage IA invasive lobular carcinoma, grade 1, E-cadherin negative, estrogen and progesterone receptor positive, HER-2 not amplified, with an MIB-1 of 70%.   (1) Status post left lumpectomy and sentinel lymph node sampling 04/02/2017 for a pT1c pN0, stage IA invasive lobular carcinoma, grade 2, with close margins, which were improved with additional surgery 04/16/2017 no residual carcinoma)  (2) Oncotype DX score of 12 predicts a 10 year risk of outside the breast recurrence of 8% if the patient's only systemic treatment is tamoxifen for 5 years. It also vertex no benefit from chemotherapy  (3) adjuvant radiation completed 07/02/2017  (4) started tamoxifen 08/15/2017  PLAN: Pearlena is now a year and a half out from definitive surgery for breast cancer with no evidence of disease recurrence.  This is favorable.  She is  tolerating tamoxifen well and the plan will be to continue that a total of 5 years.  We discussed exercise and diet issues.  She is planning to get back into her regular routine as soon as the holidays are over.  She will have mammography in February and she will see her gynecologist Dr. Valentino Saxon shortly after that.  She will return to see me in June  She knows to call for any other issues  that may develop before that visit.   Magrinat, [name], MD  11/16/18 11:13 AM Medical Oncology and Hematology [facility] [address] [place], [place] [address] Tel. [phone]    Fax. [phone]   I, Jacqualyn Posey am acting as a Education administrator for Chauncey Cruel, MD.   I, Lurline Del MD, have reviewed the above documentation for accuracy and completeness, and I agree with the above.

## 2018-11-16 ENCOUNTER — Inpatient Hospital Stay: Payer: BC Managed Care – PPO | Attending: Oncology | Admitting: Oncology

## 2018-11-16 ENCOUNTER — Inpatient Hospital Stay: Payer: BC Managed Care – PPO

## 2018-11-16 ENCOUNTER — Telehealth: Payer: Self-pay | Admitting: Oncology

## 2018-11-16 VITALS — BP 140/89 | HR 78 | Temp 98.3°F | Resp 18 | Ht 65.0 in | Wt 188.7 lb

## 2018-11-16 DIAGNOSIS — R232 Flushing: Secondary | ICD-10-CM | POA: Diagnosis not present

## 2018-11-16 DIAGNOSIS — Z923 Personal history of irradiation: Secondary | ICD-10-CM | POA: Insufficient documentation

## 2018-11-16 DIAGNOSIS — K219 Gastro-esophageal reflux disease without esophagitis: Secondary | ICD-10-CM | POA: Diagnosis not present

## 2018-11-16 DIAGNOSIS — I10 Essential (primary) hypertension: Secondary | ICD-10-CM | POA: Diagnosis not present

## 2018-11-16 DIAGNOSIS — Z807 Family history of other malignant neoplasms of lymphoid, hematopoietic and related tissues: Secondary | ICD-10-CM | POA: Diagnosis not present

## 2018-11-16 DIAGNOSIS — Z79899 Other long term (current) drug therapy: Secondary | ICD-10-CM | POA: Diagnosis not present

## 2018-11-16 DIAGNOSIS — Z170 Estrogen receptor positive status [ER+]: Secondary | ICD-10-CM | POA: Diagnosis not present

## 2018-11-16 DIAGNOSIS — C50412 Malignant neoplasm of upper-outer quadrant of left female breast: Secondary | ICD-10-CM

## 2018-11-16 DIAGNOSIS — Z79810 Long term (current) use of selective estrogen receptor modulators (SERMs): Secondary | ICD-10-CM

## 2018-11-16 LAB — CBC WITH DIFFERENTIAL/PLATELET
ABS IMMATURE GRANULOCYTES: 0.01 10*3/uL (ref 0.00–0.07)
Basophils Absolute: 0 10*3/uL (ref 0.0–0.1)
Basophils Relative: 1 %
Eosinophils Absolute: 0.2 10*3/uL (ref 0.0–0.5)
Eosinophils Relative: 5 %
HCT: 39.2 % (ref 36.0–46.0)
Hemoglobin: 12.4 g/dL (ref 12.0–15.0)
IMMATURE GRANULOCYTES: 0 %
Lymphocytes Relative: 46 %
Lymphs Abs: 1.7 10*3/uL (ref 0.7–4.0)
MCH: 27.1 pg (ref 26.0–34.0)
MCHC: 31.6 g/dL (ref 30.0–36.0)
MCV: 85.6 fL (ref 80.0–100.0)
MONOS PCT: 11 %
Monocytes Absolute: 0.4 10*3/uL (ref 0.1–1.0)
NEUTROS ABS: 1.3 10*3/uL — AB (ref 1.7–7.7)
NEUTROS PCT: 37 %
Platelets: 197 10*3/uL (ref 150–400)
RBC: 4.58 MIL/uL (ref 3.87–5.11)
RDW: 13.5 % (ref 11.5–15.5)
WBC: 3.6 10*3/uL — AB (ref 4.0–10.5)
nRBC: 0 % (ref 0.0–0.2)

## 2018-11-16 LAB — COMPREHENSIVE METABOLIC PANEL
ALT: 20 U/L (ref 0–44)
ANION GAP: 6 (ref 5–15)
AST: 20 U/L (ref 15–41)
Albumin: 3.5 g/dL (ref 3.5–5.0)
Alkaline Phosphatase: 57 U/L (ref 38–126)
BILIRUBIN TOTAL: 0.3 mg/dL (ref 0.3–1.2)
BUN: 14 mg/dL (ref 6–20)
CHLORIDE: 104 mmol/L (ref 98–111)
CO2: 30 mmol/L (ref 22–32)
Calcium: 9.5 mg/dL (ref 8.9–10.3)
Creatinine, Ser: 1.02 mg/dL — ABNORMAL HIGH (ref 0.44–1.00)
GFR calc Af Amer: >60 mL/min (ref >60)
GFR calc non Af Amer: >60 mL/min (ref >60)
GLUCOSE: 99 mg/dL (ref 70–99)
POTASSIUM: 3.4 mmol/L — AB (ref 3.5–5.1)
SODIUM: 140 mmol/L (ref 135–145)
TOTAL PROTEIN: 6.5 g/dL (ref 6.5–8.1)

## 2018-11-16 MED ORDER — TAMOXIFEN CITRATE 20 MG PO TABS: 20.0000 mg | ORAL_TABLET | Freq: Every day | ORAL | 4 refills | Status: DC

## 2018-11-16 NOTE — Telephone Encounter (Signed)
Gave avs and calendar ° °

## 2018-11-18 ENCOUNTER — Ambulatory Visit: Payer: BC Managed Care – PPO

## 2018-11-25 ENCOUNTER — Ambulatory Visit: Payer: BC Managed Care – PPO

## 2018-12-02 ENCOUNTER — Ambulatory Visit: Payer: BC Managed Care – PPO

## 2018-12-16 ENCOUNTER — Ambulatory Visit: Payer: BC Managed Care – PPO

## 2018-12-17 ENCOUNTER — Other Ambulatory Visit: Payer: Self-pay | Admitting: Adult Health

## 2018-12-17 DIAGNOSIS — Z853 Personal history of malignant neoplasm of breast: Secondary | ICD-10-CM

## 2018-12-23 ENCOUNTER — Ambulatory Visit: Payer: BC Managed Care – PPO

## 2018-12-30 ENCOUNTER — Ambulatory Visit: Payer: BC Managed Care – PPO

## 2019-01-25 ENCOUNTER — Other Ambulatory Visit: Payer: Self-pay | Admitting: Adult Health

## 2019-01-25 ENCOUNTER — Other Ambulatory Visit: Payer: Self-pay

## 2019-01-25 DIAGNOSIS — Z853 Personal history of malignant neoplasm of breast: Secondary | ICD-10-CM

## 2019-01-27 ENCOUNTER — Ambulatory Visit
Admission: RE | Admit: 2019-01-27 | Discharge: 2019-01-27 | Disposition: A | Discharge status: Home / Self Care | Payer: BC Managed Care – PPO | Source: Ambulatory Visit | Attending: Adult Health | Admitting: Adult Health

## 2019-01-27 DIAGNOSIS — Z853 Personal history of malignant neoplasm of breast: Secondary | ICD-10-CM

## 2019-06-05 NOTE — Progress Notes (Signed)
[place]  [phone] [phone]     ID: Shelly Beasley DOB: 07-08-1961  MR#: [id number]  MEB#:[id number]  Patient Care Team: Harlan Stains, MD as PCP - General (Family Medicine) Stark Klein, MD as Consulting Physician (General Surgery) [name], [name], MD as Consulting Physician (Oncology) [name], MD as Consulting Physician (Radiation Oncology) [name], MD as Consulting Physician (Orthopedic Surgery) [name], [name], NP as Nurse Practitioner (Hematology and Oncology) Aloha Gell, MD as Consulting Physician (Obstetrics and Gynecology) OTHER MD:   CHIEF COMPLAINT: Estrogen receptor positive breast cancer  CURRENT TREATMENT: Tamoxifen  BREAST CANCER HISTORY: From the original intake note:  Shelly Beasley had routine bilateral screening mammography with tomography at [facility] 01/16/2017. This showed a possible mass in the left breast. On 01/23/2017 she underwent left diagnostic mammography with tomography and ultrasonography. The breast density was category C. In the lateral posterior breast, there was an irregular 1 cm mass which by ultrasound was located at 8 cm from the nipple at the 1:30 o'clock position. This was irregular, hypoechoic, and measured 1.0 cm. Ultrasound of the left axilla was sonographically benign.  On favorite 12th 2018 [name] underwent biopsy of the left breast mass in question, and this showed ([id number]) invasive lobular carcinoma, E-cadherin negative, grade 1, estrogen receptor 100% positive, progesterone receptor 100% positive, both with strong staining intensity, with an MIB-1 of 70%, and HER-2 nonamplified, with a signals ratio of 1.24 and the number per cell 2.10.  Her subsequent history is as detailed below   INTERVAL HISTORY: Shelly Beasley returns today for follow-up and treatment of her estrogen positive breast cancer.  She continues on tamoxifen. She has some mild hot flashes. She has some  vaginal wetness.   Since her last visit here, she underwent a digital diagnostic bilateral mammogram with tomography on 01/27/2019 showing: Breast Density Category C. There is no mammographic evidence of malignancy.    REVIEW OF SYSTEMS: [name] has been staying home for the most part. Sine the start of COVID-19, she has been working from home. Her husband still works outside, but they are taking appropriate precautions against the spread against the virus. She used to get out to walk three times per week before the virus, but she wants to get out again. The patient denies unusual headaches, visual changes, nausea, vomiting, or dizziness. There has been no unusual cough, phlegm production, or pleurisy. This been no change in bowel or bladder habits. The patient denies unexplained fatigue or unexplained weight loss, bleeding, rash, or fever. A detailed review of systems was otherwise noncontributory.    PAST MEDICAL HISTORY: Past Medical History:  Diagnosis Date  . Acid reflux    takes Omeprazole as needed  . Breast cancer ([place])   . Cancer ([place]) 02/2017   left breast cancer  . History of radiation therapy 05/26/17-07/02/17   left breast 50.4 Gy  . Hypertension   . Personal history of radiation therapy     PAST SURGICAL HISTORY: Past Surgical History:  Procedure Laterality Date  . BREAST BIOPSY    . BREAST LUMPECTOMY Left    2018  . BREAST LUMPECTOMY WITH RADIOACTIVE SEED AND SENTINEL LYMPH NODE BIOPSY Left 04/02/2017   Procedure: LEFT BREAST LUMPECTOMY WITH RADIOACTIVE SEED AND SENTINEL LYMPH NODE BIOPSY;  Surgeon: Stark Klein, MD;  Location: [place];  Service: General;  Laterality: Left;  . BUNIONECTOMY Bilateral   . ENDOMETRIAL ABLATION    . RE-EXCISION OF BREAST LUMPECTOMY Left 04/16/2017  Procedure: RE-EXCISION OF BREAST LUMPECTOMY;  Surgeon: Stark Klein, MD;  Location: [place];  Service: General;  Laterality: Left;    FAMILY HISTORY Family  History  Problem Relation Age of Onset  . Multiple myeloma Mother   . Lymphoma Father   The patient's father died from lymphoma at the age of 69. The patient's mother died from myeloma at the age of 40. The patient had 5 brothers, 3 sisters. There is no history of breast or ovarian cancer in the family.  GYNECOLOGIC HISTORY:  No LMP recorded. Patient has had an ablation. Menarche age 37, first live birth age 81, the patient is [place] P3. She stopped having periods approximately 2013 after an endometrial ablation.. She did not take hormone replacement. She used oral contraceptives for approximately 2 years remotely, with no complications.  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 Her husband [name] is a Glass blower/designer. Their daughter [name] works in [place] as a Geophysicist/field seismologist. Son [name] works in Biomedical scientist also in [place]. Daughter [name], 22, is a Electronics engineer hoping to become an Chief Technology Officer. The patient has no grandchildren.  At home with the patient and her husband and daughter are 2 nonbiological children aged 44 and 35 which she has been raising for the past 4 years.  The patient attends the [employer]    ADVANCED DIRECTIVES: Not in place   HEALTH MAINTENANCE: Social History   Tobacco Use  . Smoking status: Never Smoker  . Smokeless tobacco: Never Used  Substance Use Topics  . Alcohol use: Yes    Comment: social  . Drug use: No     Colonoscopy: 2014  PAP: 2014  Bone density: Never   Allergies  Allergen Reactions  . Codeine Nausea And Vomiting    Current Outpatient Medications  Medication Sig Dispense Refill  . olmesartan-hydrochlorothiazide (BENICAR HCT) 40-12.5 MG per tablet Take 1 tablet by mouth daily.    Marland Kitchen omeprazole (PRILOSEC) 40 MG capsule Take 40 mg by mouth as needed.     . senna (SENOKOT) 8.6 MG tablet Take 1 tablet by mouth as needed for  constipation.    . tamoxifen (NOLVADEX) 20 MG tablet Take 1 tablet (20 mg total) by mouth daily. 90 tablet 4   No current facility-administered medications for this visit.     OBJECTIVE: Middle-aged African-American woman who appears younger than stated age  38:   06/06/19 0910  BP: 132/70  Pulse: 77  Resp: 18  Temp: 98.2 F (36.8 C)  SpO2: 100%     Body mass index is 29.52 kg/m.    ECOG FS:0  Sclerae unicteric, pupils round and equal No cervical or supraclavicular adenopathy Lungs no rales or rhonchi Heart regular rate and rhythm Abd soft, nontender, positive bowel sounds MSK no focal spinal tenderness, no upper extremity lymphedema Neuro: nonfocal, well oriented, appropriate affect Breasts: The right breast is benign per the left breast is status post lumpectomy and radiation.  There is minimal residual hyperpigmentation.  The cosmetic result is good.  There is no evidence of residual or recurrent disease.  Both axillae are benign.  LAB RESULTS:  CMP     Component Value Date/Time   NA 140 11/16/2018 1027   NA 140 11/03/2017 1117   K 3.4 (L) 11/16/2018 1027   K 3.4 (L) 11/03/2017 1117   CL 104 11/16/2018 1027   CO2 30 11/16/2018 1027   CO2 28  11/03/2017 1117   GLUCOSE 99 11/16/2018 1027   GLUCOSE 101 11/03/2017 1117   BUN 14 11/16/2018 1027   BUN 14.7 11/03/2017 1117   CREATININE 1.02 (H) 11/16/2018 1027   CREATININE 0.9 11/03/2017 1117   CALCIUM 9.5 11/16/2018 1027   CALCIUM 9.3 11/03/2017 1117   PROT 6.5 11/16/2018 1027   PROT 6.9 11/03/2017 1117   ALBUMIN 3.5 11/16/2018 1027   ALBUMIN 3.6 11/03/2017 1117   AST 20 11/16/2018 1027   AST 18 11/03/2017 1117   ALT 20 11/16/2018 1027   ALT 14 11/03/2017 1117   ALKPHOS 57 11/16/2018 1027   ALKPHOS 70 11/03/2017 1117   BILITOT 0.3 11/16/2018 1027   BILITOT 0.37 11/03/2017 1117   GFRNONAA >60 11/16/2018 1027   GFRAA >60 11/16/2018 1027    INo results found for: SPEP, UPEP  Lab Results  Component Value  Date   WBC 3.9 (L) 06/06/2019   NEUTROABS 1.5 (L) 06/06/2019   HGB 12.8 06/06/2019   HCT 40.1 06/06/2019   MCV 85.0 06/06/2019   PLT 187 06/06/2019      Chemistry      Component Value Date/Time   NA 140 11/16/2018 1027   NA 140 11/03/2017 1117   K 3.4 (L) 11/16/2018 1027   K 3.4 (L) 11/03/2017 1117   CL 104 11/16/2018 1027   CO2 30 11/16/2018 1027   CO2 28 11/03/2017 1117   BUN 14 11/16/2018 1027   BUN 14.7 11/03/2017 1117   CREATININE 1.02 (H) 11/16/2018 1027   CREATININE 0.9 11/03/2017 1117      Component Value Date/Time   CALCIUM 9.5 11/16/2018 1027   CALCIUM 9.3 11/03/2017 1117   ALKPHOS 57 11/16/2018 1027   ALKPHOS 70 11/03/2017 1117   AST 20 11/16/2018 1027   AST 18 11/03/2017 1117   ALT 20 11/16/2018 1027   ALT 14 11/03/2017 1117   BILITOT 0.3 11/16/2018 1027   BILITOT 0.37 11/03/2017 1117       No results found for: LABCA2  No components found for: LABCA125  No results for input(s): INR in the last 168 hours.  Urinalysis    Component Value Date/Time   COLORURINE YELLOW 12/09/2015 2237   APPEARANCEUR CLEAR 12/09/2015 2237   LABSPEC 1.024 12/09/2015 2237   PHURINE 5.5 12/09/2015 2237   GLUCOSEU NEGATIVE 12/09/2015 2237   HGBUR NEGATIVE 12/09/2015 2237   BILIRUBINUR NEGATIVE 12/09/2015 2237   KETONESUR NEGATIVE 12/09/2015 2237   PROTEINUR NEGATIVE 12/09/2015 2237   NITRITE NEGATIVE 12/09/2015 2237   LEUKOCYTESUR NEGATIVE 12/09/2015 2237     STUDIES: Mammography from February 2020 reviewed with the patient  ELIGIBLE FOR AVAILABLE RESEARCH PROTOCOL: no   ASSESSMENT: 58 y.o. Shelly Beasley woman status post left breast upper outer quadrant biopsy 01/26/2017, for a clinical T1BN0, stage IA invasive lobular carcinoma, grade 1, E-cadherin negative, estrogen and progesterone receptor positive, HER-2 not amplified, with an MIB-1 of 70%.   (1) Status post left lumpectomy and sentinel lymph node sampling 04/02/2017 for a pT1c pN0, stage IA invasive lobular  carcinoma, grade 2, with close margins, which were improved with additional surgery 04/16/2017 no residual carcinoma)  (2) Oncotype DX score of 12 predicts a 10 year risk of outside the breast recurrence of 8% if the patient's only systemic treatment is tamoxifen for 5 years. It also vertex no benefit from chemotherapy  (3) adjuvant radiation completed 07/02/2017  (4) started tamoxifen 08/15/2017   PLAN: Yashira is now a little over 2 years out from definitive surgery  for her breast cancer with no evidence of disease recurrence.  This is very favorable.  She is tolerating tamoxifen well and the plan will be to continue that a total of 5 years.  If at some point the vaginal discharge or other symptoms become more of an issue we can switch to anastrozole.  In preparation for that I will add an [facility] and estradiol to her lab work next year.  She is taking appropriate pandemic precautions.  She knows to call for any other issue that may develop before the next visit.  Cardelia Sassano, [name], MD  06/06/19 9:29 AM Medical Oncology and Hematology [facility] [address] [place], [place] [address] Tel. [phone]    Fax. [phone]  I, Jacqualyn Posey am acting as a Education administrator for Chauncey Cruel, MD.   I, Lurline Del MD, have reviewed the above documentation for accuracy and completeness, and I agree with the above.

## 2019-06-06 ENCOUNTER — Other Ambulatory Visit: Payer: Self-pay

## 2019-06-06 ENCOUNTER — Inpatient Hospital Stay: Payer: BC Managed Care – PPO | Attending: Oncology

## 2019-06-06 ENCOUNTER — Inpatient Hospital Stay (HOSPITAL_BASED_OUTPATIENT_CLINIC_OR_DEPARTMENT_OTHER): Payer: BC Managed Care – PPO | Admitting: Oncology

## 2019-06-06 VITALS — BP 132/70 | HR 77 | Temp 98.2°F | Resp 18 | Wt 177.4 lb

## 2019-06-06 DIAGNOSIS — I10 Essential (primary) hypertension: Secondary | ICD-10-CM | POA: Diagnosis not present

## 2019-06-06 DIAGNOSIS — Z170 Estrogen receptor positive status [ER+]: Secondary | ICD-10-CM

## 2019-06-06 DIAGNOSIS — C50412 Malignant neoplasm of upper-outer quadrant of left female breast: Secondary | ICD-10-CM | POA: Insufficient documentation

## 2019-06-06 DIAGNOSIS — Z79899 Other long term (current) drug therapy: Secondary | ICD-10-CM | POA: Insufficient documentation

## 2019-06-06 DIAGNOSIS — Z79810 Long term (current) use of selective estrogen receptor modulators (SERMs): Secondary | ICD-10-CM | POA: Insufficient documentation

## 2019-06-06 LAB — CBC WITH DIFFERENTIAL/PLATELET
Abs Immature Granulocytes: 0.02 10*3/uL (ref 0.00–0.07)
Basophils Absolute: 0 10*3/uL (ref 0.0–0.1)
Basophils Relative: 1 %
Eosinophils Absolute: 0.2 10*3/uL (ref 0.0–0.5)
Eosinophils Relative: 5 %
HCT: 40.1 % (ref 36.0–46.0)
Hemoglobin: 12.8 g/dL (ref 12.0–15.0)
Immature Granulocytes: 1 %
Lymphocytes Relative: 46 %
Lymphs Abs: 1.8 10*3/uL (ref 0.7–4.0)
MCH: 27.1 pg (ref 26.0–34.0)
MCHC: 31.9 g/dL (ref 30.0–36.0)
MCV: 85 fL (ref 80.0–100.0)
Monocytes Absolute: 0.3 10*3/uL (ref 0.1–1.0)
Monocytes Relative: 8 %
Neutro Abs: 1.5 10*3/uL — ABNORMAL LOW (ref 1.7–7.7)
Neutrophils Relative %: 39 %
Platelets: 187 10*3/uL (ref 150–400)
RBC: 4.72 MIL/uL (ref 3.87–5.11)
RDW: 13.9 % (ref 11.5–15.5)
WBC: 3.9 10*3/uL — ABNORMAL LOW (ref 4.0–10.5)
nRBC: 0 % (ref 0.0–0.2)

## 2019-06-06 LAB — COMPREHENSIVE METABOLIC PANEL
ALT: 12 U/L (ref 0–44)
AST: 14 U/L — ABNORMAL LOW (ref 15–41)
Albumin: 3.5 g/dL (ref 3.5–5.0)
Alkaline Phosphatase: 60 U/L (ref 38–126)
Anion gap: 11 (ref 5–15)
BUN: 11 mg/dL (ref 6–20)
CO2: 26 mmol/L (ref 22–32)
Calcium: 9 mg/dL (ref 8.9–10.3)
Chloride: 105 mmol/L (ref 98–111)
Creatinine, Ser: 0.94 mg/dL (ref 0.44–1.00)
GFR calc Af Amer: >60 mL/min (ref >60)
GFR calc non Af Amer: >60 mL/min (ref >60)
Glucose, Bld: 138 mg/dL — ABNORMAL HIGH (ref 70–99)
Potassium: 3.6 mmol/L (ref 3.5–5.1)
Sodium: 142 mmol/L (ref 135–145)
Total Bilirubin: 0.4 mg/dL (ref 0.3–1.2)
Total Protein: 6.5 g/dL (ref 6.5–8.1)

## 2019-08-01 ENCOUNTER — Other Ambulatory Visit: Payer: Self-pay | Admitting: Oncology

## 2019-12-22 ENCOUNTER — Other Ambulatory Visit: Payer: Self-pay | Admitting: Adult Health

## 2019-12-22 DIAGNOSIS — Z9889 Other specified postprocedural states: Secondary | ICD-10-CM

## 2020-01-08 ENCOUNTER — Other Ambulatory Visit: Payer: Self-pay | Admitting: Oncology

## 2020-01-30 ENCOUNTER — Other Ambulatory Visit: Payer: Self-pay

## 2020-01-30 ENCOUNTER — Ambulatory Visit
Admission: RE | Admit: 2020-01-30 | Discharge: 2020-01-30 | Disposition: A | Discharge status: Home / Self Care | Payer: BC Managed Care – PPO | Source: Ambulatory Visit | Attending: Adult Health | Admitting: Adult Health

## 2020-01-30 DIAGNOSIS — Z9889 Other specified postprocedural states: Secondary | ICD-10-CM

## 2020-02-11 ENCOUNTER — Ambulatory Visit: Payer: BC Managed Care – PPO | Attending: Internal Medicine

## 2020-02-11 DIAGNOSIS — Z23 Encounter for immunization: Secondary | ICD-10-CM

## 2020-02-11 NOTE — Progress Notes (Signed)
   Covid-19 Vaccination Clinic  Name:  Shelly Beasley    MRN: [id number] DOB: 25-Oct-1961  02/11/2020  [name] was observed post Covid-19 immunization for 15 minutes without incidence. She was provided with Vaccine Information Sheet and instruction to access the V-Safe system.   [name] was instructed to call 911 with any severe reactions post vaccine: Marland Kitchen Difficulty breathing  . Swelling of your face and throat  . A fast heartbeat  . A bad rash all over your body  . Dizziness and weakness    Immunizations Administered    Name Date Dose VIS Date Route   Pfizer COVID-19 Vaccine 02/11/2020 11:22 AM 0.3 mL 11/25/2019 Intramuscular   Manufacturer: [place]   Lot: [id number]   [place]: [id number]

## 2020-03-03 ENCOUNTER — Ambulatory Visit: Payer: BC Managed Care – PPO | Attending: Internal Medicine

## 2020-03-03 DIAGNOSIS — Z23 Encounter for immunization: Secondary | ICD-10-CM

## 2020-03-03 NOTE — Progress Notes (Signed)
   Covid-19 Vaccination Clinic  Name:  Shelly Beasley    MRN: [id number] DOB: 17-Dec-1960  03/03/2020  [name] was observed post Covid-19 immunization for 15 minutes without incident. She was provided with Vaccine Information Sheet and instruction to access the V-Safe system.   Ms. Bench was instructed to call 911 with any severe reactions post vaccine: Marland Kitchen Difficulty breathing  . Swelling of face and throat  . A fast heartbeat  . A bad rash all over body  . Dizziness and weakness   Immunizations Administered    Name Date Dose VIS Date Route   Pfizer COVID-19 Vaccine 03/03/2020 12:02 PM 0.3 mL 11/25/2019 Intramuscular   Manufacturer: [place]   Lot: [id number]   [place]: [id number]

## 2020-03-07 ENCOUNTER — Ambulatory Visit: Payer: BC Managed Care – PPO

## 2020-04-06 ENCOUNTER — Other Ambulatory Visit: Payer: Self-pay | Admitting: Family Medicine

## 2020-04-06 ENCOUNTER — Ambulatory Visit
Admission: RE | Admit: 2020-04-06 | Discharge: 2020-04-06 | Disposition: A | Discharge status: Home / Self Care | Payer: BC Managed Care – PPO | Source: Ambulatory Visit | Attending: Family Medicine | Admitting: Family Medicine

## 2020-04-06 ENCOUNTER — Other Ambulatory Visit: Payer: Self-pay

## 2020-04-06 DIAGNOSIS — M542 Cervicalgia: Secondary | ICD-10-CM

## 2020-04-06 DIAGNOSIS — M25572 Pain in left ankle and joints of left foot: Secondary | ICD-10-CM

## 2020-04-06 DIAGNOSIS — M79672 Pain in left foot: Secondary | ICD-10-CM

## 2020-06-05 ENCOUNTER — Inpatient Hospital Stay (HOSPITAL_BASED_OUTPATIENT_CLINIC_OR_DEPARTMENT_OTHER): Payer: BC Managed Care – PPO | Admitting: Oncology

## 2020-06-05 ENCOUNTER — Inpatient Hospital Stay: Payer: BC Managed Care – PPO | Attending: Oncology

## 2020-06-05 ENCOUNTER — Other Ambulatory Visit: Payer: Self-pay

## 2020-06-05 VITALS — BP 147/76 | HR 79 | Temp 98.5°F | Resp 20 | Ht 65.0 in | Wt 181.7 lb

## 2020-06-05 DIAGNOSIS — K219 Gastro-esophageal reflux disease without esophagitis: Secondary | ICD-10-CM | POA: Insufficient documentation

## 2020-06-05 DIAGNOSIS — Z79810 Long term (current) use of selective estrogen receptor modulators (SERMs): Secondary | ICD-10-CM | POA: Diagnosis not present

## 2020-06-05 DIAGNOSIS — I10 Essential (primary) hypertension: Secondary | ICD-10-CM | POA: Insufficient documentation

## 2020-06-05 DIAGNOSIS — Z170 Estrogen receptor positive status [ER+]: Secondary | ICD-10-CM

## 2020-06-05 DIAGNOSIS — C50412 Malignant neoplasm of upper-outer quadrant of left female breast: Secondary | ICD-10-CM

## 2020-06-05 DIAGNOSIS — Z79899 Other long term (current) drug therapy: Secondary | ICD-10-CM | POA: Insufficient documentation

## 2020-06-05 DIAGNOSIS — D708 Other neutropenia: Secondary | ICD-10-CM

## 2020-06-05 LAB — COMPREHENSIVE METABOLIC PANEL
ALT: 17 U/L (ref 0–44)
AST: 18 U/L (ref 15–41)
Albumin: 3.5 g/dL (ref 3.5–5.0)
Alkaline Phosphatase: 62 U/L (ref 38–126)
Anion gap: 11 (ref 5–15)
BUN: 12 mg/dL (ref 6–20)
CO2: 24 mmol/L (ref 22–32)
Calcium: 9 mg/dL (ref 8.9–10.3)
Chloride: 102 mmol/L (ref 98–111)
Creatinine, Ser: 1 mg/dL (ref 0.44–1.00)
GFR calc Af Amer: >60 mL/min (ref >60)
GFR calc non Af Amer: >60 mL/min (ref >60)
Glucose, Bld: 184 mg/dL — ABNORMAL HIGH (ref 70–99)
Potassium: 3.5 mmol/L (ref 3.5–5.1)
Sodium: 137 mmol/L (ref 135–145)
Total Bilirubin: 0.3 mg/dL (ref 0.3–1.2)
Total Protein: 6.6 g/dL (ref 6.5–8.1)

## 2020-06-05 LAB — CBC WITH DIFFERENTIAL/PLATELET
Abs Immature Granulocytes: 0.01 10*3/uL (ref 0.00–0.07)
Basophils Absolute: 0 10*3/uL (ref 0.0–0.1)
Basophils Relative: 1 %
Eosinophils Absolute: 0.2 10*3/uL (ref 0.0–0.5)
Eosinophils Relative: 5 %
HCT: 38.7 % (ref 36.0–46.0)
Hemoglobin: 12.5 g/dL (ref 12.0–15.0)
Immature Granulocytes: 0 %
Lymphocytes Relative: 56 %
Lymphs Abs: 2.3 10*3/uL (ref 0.7–4.0)
MCH: 28.2 pg (ref 26.0–34.0)
MCHC: 32.3 g/dL (ref 30.0–36.0)
MCV: 87.4 fL (ref 80.0–100.0)
Monocytes Absolute: 0.3 10*3/uL (ref 0.1–1.0)
Monocytes Relative: 8 %
Neutro Abs: 1.2 10*3/uL — ABNORMAL LOW (ref 1.7–7.7)
Neutrophils Relative %: 30 %
Platelets: 203 10*3/uL (ref 150–400)
RBC: 4.43 MIL/uL (ref 3.87–5.11)
RDW: 13.8 % (ref 11.5–15.5)
WBC: 4 10*3/uL (ref 4.0–10.5)
nRBC: 0 % (ref 0.0–0.2)

## 2020-06-05 MED ORDER — TAMOXIFEN CITRATE 20 MG PO TABS: 20.0000 mg | ORAL_TABLET | Freq: Every day | ORAL | 4 refills | Status: DC
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
## 2020-06-06 ENCOUNTER — Telehealth: Payer: Self-pay | Admitting: Oncology

## 2020-06-06 LAB — FOLLICLE STIMULATING HORMONE: FSH: 17.6 m[IU]/mL

## 2020-06-06 NOTE — Telephone Encounter (Signed)
Scheduled appts per 6/22 los. Pt confirmed appt date and time.

## 2020-06-09 LAB — ESTRADIOL, ULTRA SENS: Estradiol, Sensitive: 6.4 pg/mL

## 2020-12-10 ENCOUNTER — Other Ambulatory Visit: Payer: Self-pay | Admitting: Adult Health

## 2020-12-10 DIAGNOSIS — Z853 Personal history of malignant neoplasm of breast: Secondary | ICD-10-CM

## 2021-01-31 ENCOUNTER — Ambulatory Visit
Admission: RE | Admit: 2021-01-31 | Discharge: 2021-01-31 | Disposition: A | Discharge status: Home / Self Care | Payer: BC Managed Care – PPO | Source: Ambulatory Visit | Attending: Adult Health | Admitting: Adult Health

## 2021-01-31 ENCOUNTER — Other Ambulatory Visit: Payer: Self-pay

## 2021-01-31 DIAGNOSIS — Z853 Personal history of malignant neoplasm of breast: Secondary | ICD-10-CM

## 2021-03-28 ENCOUNTER — Encounter (HOSPITAL_COMMUNITY): Payer: Self-pay | Admitting: Emergency Medicine

## 2021-03-28 ENCOUNTER — Emergency Department (HOSPITAL_COMMUNITY)
Admission: EM | Admit: 2021-03-28 | Discharge: 2021-03-28 | Disposition: A | Discharge status: Home / Self Care | Payer: BC Managed Care – PPO | Attending: Emergency Medicine | Admitting: Emergency Medicine

## 2021-03-28 DIAGNOSIS — Z79899 Other long term (current) drug therapy: Secondary | ICD-10-CM | POA: Insufficient documentation

## 2021-03-28 DIAGNOSIS — F419 Anxiety disorder, unspecified: Secondary | ICD-10-CM | POA: Insufficient documentation

## 2021-03-28 DIAGNOSIS — R002 Palpitations: Secondary | ICD-10-CM | POA: Insufficient documentation

## 2021-03-28 DIAGNOSIS — Z853 Personal history of malignant neoplasm of breast: Secondary | ICD-10-CM | POA: Diagnosis not present

## 2021-03-28 DIAGNOSIS — I10 Essential (primary) hypertension: Secondary | ICD-10-CM | POA: Insufficient documentation

## 2021-03-28 LAB — CBC
HCT: 41.1 % (ref 36.0–46.0)
Hemoglobin: 13.2 g/dL (ref 12.0–15.0)
MCH: 28.3 pg (ref 26.0–34.0)
MCHC: 32.1 g/dL (ref 30.0–36.0)
MCV: 88 fL (ref 80.0–100.0)
Platelets: 217 10*3/uL (ref 150–400)
RBC: 4.67 MIL/uL (ref 3.87–5.11)
RDW: 13.7 % (ref 11.5–15.5)
WBC: 4.5 10*3/uL (ref 4.0–10.5)
nRBC: 0 % (ref 0.0–0.2)

## 2021-03-28 LAB — BASIC METABOLIC PANEL
Anion gap: 9 (ref 5–15)
BUN: 13 mg/dL (ref 6–20)
CO2: 26 mmol/L (ref 22–32)
Calcium: 9.3 mg/dL (ref 8.9–10.3)
Chloride: 106 mmol/L (ref 98–111)
Creatinine, Ser: 0.93 mg/dL (ref 0.44–1.00)
GFR, Estimated: >60 mL/min (ref >60)
Glucose, Bld: 99 mg/dL (ref 70–99)
Potassium: 3.7 mmol/L (ref 3.5–5.1)
Sodium: 141 mmol/L (ref 135–145)

## 2021-03-28 NOTE — ED Provider Notes (Signed)
[facility] DEPT Provider Note   CSN: [id number] Arrival date & time: 03/28/21  1242     History Chief Complaint  Patient presents with  . Palpitations  . Stress    Shelly Beasley is a 60 y.o. 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 Jana Hakim who presents the ED via EMS from work for increased anxiety and palpitations.  On my examination, patient reports that she was at work sitting at her desk on the computer thinking about what she was going to order for lunch when all of a sudden she developed palpitations.  She checked her watch and thought that her heart rate was elevated.  EMS arrived and noted that she was initially tachycardic and hypertensive, but that her vitals improved.  She states that her palpitations lasted approximately 20 to 30 minutes before resolving spontaneously.  She denied any associated shortness of breath, chest pain, diaphoresis, nausea, abdominal discomfort, lightheadedness, room spinning dizziness, blurred vision, or any other symptoms.  She denies she also denies any history of clots, recent unilateral extremity swelling or edema, or any symptoms at present.  She states that she is followed by Dr. [facility].  Patient notes that she has a teenage son who has been making her life very difficult recently.  She becomes teary-eyed on my examination and states that she has been increasingly stressed regarding some of his behavior.  She has not yet expressed this to her primary care provider.  HPI     Past Medical History:  Diagnosis Date  . Acid reflux    takes Omeprazole as needed  . Breast cancer ([place])   . Cancer ([place]) 02/2017   left breast cancer  . History of radiation therapy 05/26/17-07/02/17   left breast 50.4 Gy  . Hypertension   . Personal history of radiation therapy     Patient Active  Problem List   Diagnosis Date Noted  . Chronic benign neutropenia ([place]) 06/05/2020  . Malignant neoplasm of upper-outer quadrant of left breast in female, estrogen receptor positive ([place]) 02/03/2017    Past Surgical History:  Procedure Laterality Date  . BREAST BIOPSY    . BREAST LUMPECTOMY Left    2018  . BREAST LUMPECTOMY WITH RADIOACTIVE SEED AND SENTINEL LYMPH NODE BIOPSY Left 04/02/2017   Procedure: LEFT BREAST LUMPECTOMY WITH RADIOACTIVE SEED AND SENTINEL LYMPH NODE BIOPSY;  Surgeon: Stark Klein, MD;  Location: [facility];  Service: General;  Laterality: Left;  . BUNIONECTOMY Bilateral   . ENDOMETRIAL ABLATION    . RE-EXCISION OF BREAST LUMPECTOMY Left 04/16/2017   Procedure: RE-EXCISION OF BREAST LUMPECTOMY;  Surgeon: Stark Klein, MD;  Location: [facility];  Service: General;  Laterality: Left;     OB History   No obstetric history on file.     Family History  Problem Relation Age of Onset  . Multiple myeloma Mother   . Lymphoma Father     Social History   Tobacco Use  . Smoking status: Never Smoker  . Smokeless tobacco: Never Used  Vaping Use  . Vaping Use: Never used  Substance Use Topics  . Alcohol use: Yes    Comment: social  . Drug use: No    Home Medications Prior to Admission medications   Medication Sig Start Date End Date Taking? Authorizing Provider  olmesartan-hydrochlorothiazide (BENICAR HCT) 20-12.5 MG tablet Take 1 tablet  by mouth daily. 01/06/21  Yes [provider]  tamoxifen (NOLVADEX) 20 MG tablet Take 1 tablet (20 mg total) by mouth daily. 06/05/20  Yes Magrinat, [name], MD    Allergies    Codeine  Review of Systems   Review of Systems  All other systems reviewed and are negative.   Physical Exam Updated Vital Signs BP (!) 141/92   Pulse 66   Temp 97.7 F (36.5 C) (Oral)   Resp 16   SpO2 94%   Physical Exam Vitals and nursing note reviewed. Exam conducted with a chaperone present.   Constitutional:      General: She is not in acute distress.    Appearance: Normal appearance. She is not ill-appearing or toxic-appearing.  HENT:     Head: Normocephalic and atraumatic.  Eyes:     General: No scleral icterus.    Conjunctiva/sclera: Conjunctivae normal.  Cardiovascular:     Rate and Rhythm: Normal rate and regular rhythm.     Pulses: Normal pulses.  Pulmonary:     Effort: Pulmonary effort is normal.  Musculoskeletal:     Cervical back: Normal range of motion. No rigidity.     Right lower leg: No edema.     Left lower leg: No edema.  Skin:    General: Skin is dry.  Neurological:     Mental Status: She is alert and oriented to person, place, and time.     GCS: GCS eye subscore is 4. GCS verbal subscore is 5. GCS motor subscore is 6.  Psychiatric:        Mood and Affect: Mood normal.        Behavior: Behavior normal.        Thought Content: Thought content normal.     ED Results / Procedures / Treatments   Labs (all labs ordered are listed, but only abnormal results are displayed) Labs Reviewed  BASIC METABOLIC PANEL  CBC    EKG None  Radiology No results found.  Procedures Procedures   Medications Ordered in ED Medications - No data to display  ED Course  I have reviewed the triage vital signs and the nursing notes.  Pertinent labs & imaging results that were available during my care of the patient were reviewed by me and considered in my medical decision making (see chart for details).    MDM Rules/Calculators/A&P                          Shelly Beasley was evaluated in Emergency Department on 03/28/2021 for the symptoms described in the history of present illness. She was evaluated in the context of the global COVID-19 pandemic, which necessitated consideration that the patient might be at risk for infection with the SARS-CoV-2 virus that causes COVID-19. Institutional protocols and algorithms that pertain to the evaluation of patients at risk  for COVID-19 are in a state of rapid change based on information released by regulatory bodies including the CDC and federal and state organizations. These policies and algorithms were followed during the patient's care in the ED.  I personally reviewed patient's medical chart and all notes from triage and staff during today's encounter. I have also ordered and reviewed all labs and imaging that I felt to be medically necessary in the evaluation of this patient's complaints and with consideration of their physical exam. If needed, translation services were available and utilized.   Patient with 30 minutes of palpitations prior to  arrival to the ED.  While she is teary-eyed here in the ED and endorses increased stress and anxiety involving her teenage son, her story of palpitations that began while thinking about what to order for lunch while sitting comfortably at her desk is also concerning for arrhythmia.  Her EKG here in the ED demonstrates sinus rhythm, however discussed referral to cardiology for Zio patch or Holter monitor placement.    She is PERC negative.  No history of thyroid disease or thyromegaly on exam.  She denies any illicit drug use.  She is resting comfortably on my examination and denies any nausea symptoms.  Vital signs are all stable and within normal limits.  I do not feel as though further work-up is warranted.  She and her husband agree.  She will follow-up with her primary care provider regarding today's encounter.  She understands the need to discuss her increasing stress and anxiety as well as pursue further evaluation for potential paroxysmal arrhythmia.  ED return precautions discussed.  Patient voices understanding and is agreeable to the plan.    Final Clinical Impression(s) / ED Diagnoses Final diagnoses:  Palpitations    Rx / DC Orders ED Discharge Orders    None       Corena Herter, PA-C 03/28/21 1511    Sherwood Gambler, MD 03/28/21 1752

## 2021-03-28 NOTE — ED Triage Notes (Signed)
Per EMS, patient from work, increased anxiety after palpitations at work. Initially tachycardic and hypertensive. Last HR 78, BP 144/90. Reports increased emotional stress due to delinquent teenager. Hx breast cancer. Denies chest pain. L arm restricted.  20g R AC

## 2021-03-28 NOTE — Discharge Instructions (Addendum)
Your basic laboratory work-up and EKG was reviewed and reassuring.  However, cannot exclude arrhythmia (abnormal heart rhythm) as cause for your palpitations x30 minutes prior to arrival.  I encourage you to follow-up with cardiology or your primary care provider for Holter monitor or Zio patch placement for further evaluation.  I would also like for you to discuss with them your recent stressors.  Return to the ED or seek immediate medical attention should you experience any new or worsening symptoms.

## 2021-06-10 ENCOUNTER — Other Ambulatory Visit: Payer: Self-pay | Admitting: *Deleted

## 2021-06-10 DIAGNOSIS — Z170 Estrogen receptor positive status [ER+]: Secondary | ICD-10-CM

## 2021-06-10 NOTE — Progress Notes (Signed)
[facility]  [phone] [phone]     ID: Shelly Beasley DOB: Aug 18, 1961  MR#: [id number]  ERX#:[id number]  Patient Care Team: Harlan Stains, MD as PCP - General (Family Medicine) Stark Klein, MD as Consulting Physician (General Surgery) Jayle Solarz, [name], MD as Consulting Physician (Oncology) [name], MD as Consulting Physician (Radiation Oncology) [name], MD as Consulting Physician (Orthopedic Surgery) [name], [name], NP as Nurse Practitioner (Hematology and Oncology) Aloha Gell, MD as Consulting Physician (Obstetrics and Gynecology) OTHER MD:   CHIEF COMPLAINT: Estrogen receptor positive breast cancer  CURRENT TREATMENT: Tamoxifen   INTERVAL HISTORY: Shelly Beasley returns today for follow-up of her estrogen positive breast cancer.  She continues on tamoxifen. She has moderate hot flashes which do not wake her up at night.  She has mild vaginal wetness occasionally.  Overall she tolerates the drug well.  Since her last visit, she underwent bilateral diagnostic mammography with tomography at [facility] on 01/31/2021 showing: breast density category C; no evidence of malignancy in either breast.     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  She says the 58 year old she is raising (she has full guardianship together with the 58 year old neither of whom is related by blood) did not do so well in school this year and she is having some issues with that.  [place] at [place] 3 times a week for exercise.  A detailed review of systems today was otherwise stable.   COVID 19 VACCINATION STATUS: [place] x2, most recently 02/2020, followed by booster x2   BREAST CANCER HISTORY: From the original intake note:  Shelly Beasley had routine bilateral screening mammography with tomography at the [facility] 01/16/2017. This showed a  possible mass in the left breast. On 01/23/2017 she underwent left diagnostic mammography with tomography and ultrasonography. The breast density was category C. In the lateral posterior breast, there was an irregular 1 cm mass which by ultrasound was located at 8 cm from the nipple at the 1:30 o'clock position. This was irregular, hypoechoic, and measured 1.0 cm. Ultrasound of the left axilla was sonographically benign.  On favorite 12th 2018 Shelly Beasley underwent biopsy of the left breast mass in question, and this showed ([id number]) invasive lobular carcinoma, E-cadherin negative, grade 1, estrogen receptor 100% positive, progesterone receptor 100% positive, both with strong staining intensity, with an MIB-1 of 70%, and HER-2 nonamplified, with a signals ratio of 1.24 and the number per cell 2.10.  Her subsequent history is as detailed below   PAST MEDICAL HISTORY: Past Medical History:  Diagnosis Date   Acid reflux    takes Omeprazole as needed   Breast cancer ([place])    Cancer ([facility]) 02/2017   left breast cancer   History of radiation therapy 05/26/17-07/02/17   left breast 50.4 Gy   Hypertension    Personal history of radiation therapy     PAST SURGICAL HISTORY: Past Surgical History:  Procedure Laterality Date   BREAST BIOPSY     BREAST LUMPECTOMY Left    2018   BREAST LUMPECTOMY WITH RADIOACTIVE SEED AND SENTINEL LYMPH NODE BIOPSY Left 04/02/2017   Procedure: LEFT BREAST LUMPECTOMY WITH RADIOACTIVE SEED AND SENTINEL LYMPH NODE BIOPSY;  Surgeon: Stark Klein, MD;  Location: [facility];  Service: General;  Laterality: Left;   BUNIONECTOMY Bilateral    ENDOMETRIAL ABLATION     RE-EXCISION  OF BREAST LUMPECTOMY Left 04/16/2017   Procedure: RE-EXCISION OF BREAST LUMPECTOMY;  Surgeon: Stark Klein, MD;  Location: [facility];  Service: General;  Laterality: Left;    FAMILY HISTORY Family History  Problem Relation Age of Onset   Multiple myeloma Mother     Lymphoma Father   The patient's father died from lymphoma at the age of 58. The patient's mother died from myeloma at the age of 58. The patient had 5 brothers, 3 sisters. There is no history of breast or ovarian cancer in the family.   GYNECOLOGIC HISTORY:  No LMP recorded. Patient has had an ablation. Menarche age 53, first live birth age 46, the patient is Shelly Beasley P3. She stopped having periods approximately 2013 after an endometrial ablation.. She did not take hormone replacement. She used oral contraceptives for approximately 2 years remotely, with no complications.   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 Their daughter [name] works in [place] as a Geophysicist/field seismologist. Son [name] works in Biomedical scientist also in [place]. Daughter Genesis Stanton Kidney, is working in Herbalist. The patient has no grandchildren.  At home with the patient and her husband and daughter are 2 nonbiological children aged 69 and 60; she has full guardianship the patient attends the [employer]    ADVANCED DIRECTIVES: Not in place   HEALTH MAINTENANCE: Social History   Tobacco Use   Smoking status: Never   Smokeless tobacco: Never  Vaping Use   Vaping Use: Never used  Substance Use Topics   Alcohol use: Yes    Comment: social   Drug use: No     Colonoscopy: 2014  PAP: 2014  Bone density: Never   Allergies  Allergen Reactions   Codeine Nausea And Vomiting    Current Outpatient Medications  Medication Sig Dispense Refill   olmesartan-hydrochlorothiazide (BENICAR HCT) 20-12.5 MG tablet Take 1 tablet by mouth daily.     tamoxifen (NOLVADEX) 20 MG tablet Take 1 tablet (20 mg total) by mouth daily. 90 tablet 4   No current facility-administered medications for this visit.    OBJECTIVE: African-American woman who appears younger than stated  age  60:   06/11/21 1526  BP: (!) 147/82  Pulse: 73  Resp: 18  Temp: (!) 97.5 F (36.4 C)  SpO2: 100%      Body mass index is 30.05 kg/m.    ECOG FS:0  Sclerae unicteric, EOMs intact Wearing a mask No cervical or supraclavicular adenopathy Lungs no rales or rhonchi Heart regular rate and rhythm Abd soft, nontender, positive bowel sounds MSK no focal spinal tenderness, no upper extremity lymphedema Neuro: nonfocal, well oriented, appropriate affect Breasts: The right breast is benign.  The left breast is status postlumpectomy and radiation.  There is no evidence of local recurrence.  Both axillae are benign   LAB RESULTS:  CMP     Component Value Date/Time   NA 139 06/11/2021 1506   NA 140 11/03/2017 1117   K 3.4 (L) 06/11/2021 1506   K 3.4 (L) 11/03/2017 1117   CL 103 06/11/2021 1506   CO2 27 06/11/2021 1506   CO2 28 11/03/2017 1117   GLUCOSE 121 (H) 06/11/2021 1506   GLUCOSE 101 11/03/2017 1117   BUN 11 06/11/2021 1506   BUN 14.7 11/03/2017 1117   CREATININE 0.80 06/11/2021 1506   CREATININE 0.9 11/03/2017 1117   CALCIUM  9.0 06/11/2021 1506   CALCIUM 9.3 11/03/2017 1117   PROT 6.5 06/11/2021 1506   PROT 6.9 11/03/2017 1117   ALBUMIN 3.4 (L) 06/11/2021 1506   ALBUMIN 3.6 11/03/2017 1117   AST 15 06/11/2021 1506   AST 18 11/03/2017 1117   ALT 11 06/11/2021 1506   ALT 14 11/03/2017 1117   ALKPHOS 60 06/11/2021 1506   ALKPHOS 70 11/03/2017 1117   BILITOT 0.3 06/11/2021 1506   BILITOT 0.37 11/03/2017 1117   GFRNONAA >60 06/11/2021 1506   GFRAA >60 06/05/2020 1509    INo results found for: SPEP, UPEP  Lab Results  Component Value Date   WBC 4.8 06/11/2021   NEUTROABS 1.6 (L) 06/11/2021   HGB 12.4 06/11/2021   HCT 37.3 06/11/2021   MCV 85.0 06/11/2021   PLT 184 06/11/2021      Chemistry      Component Value Date/Time   NA 139 06/11/2021 1506   NA 140 11/03/2017 1117   K 3.4 (L) 06/11/2021 1506   K 3.4 (L) 11/03/2017 1117   CL 103  06/11/2021 1506   CO2 27 06/11/2021 1506   CO2 28 11/03/2017 1117   BUN 11 06/11/2021 1506   BUN 14.7 11/03/2017 1117   CREATININE 0.80 06/11/2021 1506   CREATININE 0.9 11/03/2017 1117      Component Value Date/Time   CALCIUM 9.0 06/11/2021 1506   CALCIUM 9.3 11/03/2017 1117   ALKPHOS 60 06/11/2021 1506   ALKPHOS 70 11/03/2017 1117   AST 15 06/11/2021 1506   AST 18 11/03/2017 1117   ALT 11 06/11/2021 1506   ALT 14 11/03/2017 1117   BILITOT 0.3 06/11/2021 1506   BILITOT 0.37 11/03/2017 1117       No results found for: LABCA2  No components found for: LABCA125  No results for input(s): INR in the last 168 hours.  Urinalysis    Component Value Date/Time   COLORURINE YELLOW 12/09/2015 2237   APPEARANCEUR CLEAR 12/09/2015 2237   LABSPEC 1.024 12/09/2015 2237   PHURINE 5.5 12/09/2015 2237   GLUCOSEU NEGATIVE 12/09/2015 2237   HGBUR NEGATIVE 12/09/2015 2237   BILIRUBINUR NEGATIVE 12/09/2015 2237   KETONESUR NEGATIVE 12/09/2015 2237   PROTEINUR NEGATIVE 12/09/2015 2237   NITRITE NEGATIVE 12/09/2015 2237   LEUKOCYTESUR NEGATIVE 12/09/2015 2237    STUDIES: No results found.   ELIGIBLE FOR AVAILABLE RESEARCH PROTOCOL: no   ASSESSMENT: 60 y.o. [place] woman status post left breast upper outer quadrant biopsy 01/26/2017, for a clinical T1b N0, stage IA invasive lobular carcinoma, grade 1, E-cadherin negative, estrogen and progesterone receptor positive, HER-2 not amplified, with an MIB-1 of 70%.   (1) Status post left lumpectomy and sentinel lymph node sampling 04/02/2017 for a pT1c pN0, stage IA invasive lobular carcinoma, grade 2, with close margins, which were improved with additional surgery 04/16/2017 (no residual carcinoma)  (2) Oncotype DX score of 12 predicts a 10 year risk of outside the breast recurrence of 8% if the patient's only systemic treatment is tamoxifen for 5 years. It also vertex no benefit from chemotherapy  (3) adjuvant radiation 05/26/2017 -  07/02/2017  Site/dose: Left breast/ 1.8 Gy/fx, 28 txs, 50.4 Gy    (4) started tamoxifen 08/15/2017   PLAN: Shelly Beasley is a little over 4 years out from definitive surgery for her breast cancer with no evidence of disease recurrence.  This is very favorable.  She is tolerating tamoxifen well.  When she returns in a year she will complete 5 years on this  drug.  I frequently offer my lobular breast cancer patients continuing tamoxifen an additional 5 years since the lobular breast cancers tend to recur late.  Many patients however prefer to "get out of the cancer business" after 5 years.  She will discuss that in more detail at that time.    Her total white cell count has been normal in the more recent determinations.  She understands that for African-Americans in the low normal for the total WBC is 3.5.  It is not uncommon for African-Americans to have a slightly low neutrophil count and that is the case here.  This is a benign finding and as she has not had any unusual infections at any point.  It requires only observation  Total encounter time today 25 minutes.*  Javae Braaten, [name], MD  06/11/21 4:01 PM Medical Oncology and Hematology [facility] [place],  [address] Tel. [phone]    Fax. [phone]   I, Wilburn Mylar, am acting as scribe for Dr. [name]. Esmay Amspacher.  I, Lurline Del MD, have reviewed the above documentation for accuracy and completeness, and I agree with the above.   *Total Encounter Time as defined by the Centers for Medicare and Medicaid Services includes, in addition to the face-to-face time of a patient visit (documented in the note above) non-face-to-face time: obtaining and reviewing outside history, ordering and reviewing medications, tests or procedures, care coordination (communications with other health care professionals or caregivers) and documentation in the medical record.

## 2021-06-11 ENCOUNTER — Other Ambulatory Visit: Payer: Self-pay

## 2021-06-11 ENCOUNTER — Inpatient Hospital Stay: Payer: BC Managed Care – PPO

## 2021-06-11 ENCOUNTER — Inpatient Hospital Stay: Payer: BC Managed Care – PPO | Attending: Oncology | Admitting: Oncology

## 2021-06-11 VITALS — BP 147/82 | HR 73 | Temp 97.5°F | Resp 18 | Ht 65.0 in | Wt 180.6 lb

## 2021-06-11 DIAGNOSIS — Z79810 Long term (current) use of selective estrogen receptor modulators (SERMs): Secondary | ICD-10-CM | POA: Diagnosis not present

## 2021-06-11 DIAGNOSIS — Z170 Estrogen receptor positive status [ER+]: Secondary | ICD-10-CM

## 2021-06-11 DIAGNOSIS — C50412 Malignant neoplasm of upper-outer quadrant of left female breast: Secondary | ICD-10-CM | POA: Insufficient documentation

## 2021-06-11 DIAGNOSIS — K219 Gastro-esophageal reflux disease without esophagitis: Secondary | ICD-10-CM | POA: Diagnosis not present

## 2021-06-11 DIAGNOSIS — Z923 Personal history of irradiation: Secondary | ICD-10-CM | POA: Diagnosis not present

## 2021-06-11 DIAGNOSIS — I10 Essential (primary) hypertension: Secondary | ICD-10-CM | POA: Diagnosis not present

## 2021-06-11 DIAGNOSIS — Z79899 Other long term (current) drug therapy: Secondary | ICD-10-CM | POA: Diagnosis not present

## 2021-06-11 LAB — CBC WITH DIFFERENTIAL (CANCER CENTER ONLY)
Abs Immature Granulocytes: 0.02 10*3/uL (ref 0.00–0.07)
Basophils Absolute: 0 10*3/uL (ref 0.0–0.1)
Basophils Relative: 1 %
Eosinophils Absolute: 0.2 10*3/uL (ref 0.0–0.5)
Eosinophils Relative: 4 %
HCT: 37.3 % (ref 36.0–46.0)
Hemoglobin: 12.4 g/dL (ref 12.0–15.0)
Immature Granulocytes: 0 %
Lymphocytes Relative: 52 %
Lymphs Abs: 2.5 10*3/uL (ref 0.7–4.0)
MCH: 28.2 pg (ref 26.0–34.0)
MCHC: 33.2 g/dL (ref 30.0–36.0)
MCV: 85 fL (ref 80.0–100.0)
Monocytes Absolute: 0.5 10*3/uL (ref 0.1–1.0)
Monocytes Relative: 10 %
Neutro Abs: 1.6 10*3/uL — ABNORMAL LOW (ref 1.7–7.7)
Neutrophils Relative %: 33 %
Platelet Count: 184 10*3/uL (ref 150–400)
RBC: 4.39 MIL/uL (ref 3.87–5.11)
RDW: 13.3 % (ref 11.5–15.5)
WBC Count: 4.8 10*3/uL (ref 4.0–10.5)
nRBC: 0 % (ref 0.0–0.2)

## 2021-06-11 LAB — CMP (CANCER CENTER ONLY)
ALT: 11 U/L (ref 0–44)
AST: 15 U/L (ref 15–41)
Albumin: 3.4 g/dL — ABNORMAL LOW (ref 3.5–5.0)
Alkaline Phosphatase: 60 U/L (ref 38–126)
Anion gap: 9 (ref 5–15)
BUN: 11 mg/dL (ref 6–20)
CO2: 27 mmol/L (ref 22–32)
Calcium: 9 mg/dL (ref 8.9–10.3)
Chloride: 103 mmol/L (ref 98–111)
Creatinine: 0.8 mg/dL (ref 0.44–1.00)
GFR, Estimated: >60 mL/min (ref >60)
Glucose, Bld: 121 mg/dL — ABNORMAL HIGH (ref 70–99)
Potassium: 3.4 mmol/L — ABNORMAL LOW (ref 3.5–5.1)
Sodium: 139 mmol/L (ref 135–145)
Total Bilirubin: 0.3 mg/dL (ref 0.3–1.2)
Total Protein: 6.5 g/dL (ref 6.5–8.1)

## 2021-06-13 ENCOUNTER — Telehealth: Payer: Self-pay | Admitting: Hematology and Oncology

## 2021-06-13 NOTE — Telephone Encounter (Signed)
Scheduled appointment per 06/28 los. Patient is aware.

## 2021-07-01 ENCOUNTER — Other Ambulatory Visit: Payer: Self-pay | Admitting: Oncology

## 2021-12-10 ENCOUNTER — Other Ambulatory Visit: Payer: Self-pay | Admitting: Adult Health

## 2021-12-10 DIAGNOSIS — Z853 Personal history of malignant neoplasm of breast: Secondary | ICD-10-CM

## 2022-02-03 ENCOUNTER — Ambulatory Visit
Admission: RE | Admit: 2022-02-03 | Discharge: 2022-02-03 | Disposition: A | Discharge status: Home / Self Care | Payer: BC Managed Care – PPO | Source: Ambulatory Visit | Attending: Adult Health | Admitting: Adult Health

## 2022-02-03 DIAGNOSIS — Z853 Personal history of malignant neoplasm of breast: Secondary | ICD-10-CM

## 2022-04-03 ENCOUNTER — Telehealth: Payer: Self-pay | Admitting: Hematology and Oncology

## 2022-04-03 NOTE — Progress Notes (Signed)
 [name] [address] [facility] - [place] [address] [place] [address] Dept: [phone] Clinical Visit Note     CHIEF COMPLAINT Patient presents for Eye Exam   HISTORY OF PRESENT ILLNESS: Shelly Beasley is a 61 y.o. old female who presents to the clinic today for:   HPI    Eye Exam   I, the attending physician, performed the HPI with the patient and updated the documentation appropriately.  The patient reports symptoms in both eyes.  Quality: good.  Severity: No change.  Onset: Since last eye exam .  The symptoms are stable.  The patient has tried glasses (And contact lenses).  The symptoms had signifcant improvement with treatment.  The patient requests a new glasses prescription.  The patient does not need an eye medication refill.  Review of eye symptoms reveals: floaters.  Negative for flashes and eye pain.       Comments   Patient here for routine eye exam and CL check. Patient reports no changes in vision ou since last eye exam. When pt wears CL's (occasional) the CL fit well and are comfortable. Occ floater, not disruptive. Fm hx: glaucoma (sister).       Last edited by Shelly Debby Platts, MD on 04/03/2022  9:08 AM.      HISTORICAL INFORMATION:    CURRENT MEDICATIONS:  Current Outpatient Medications:    olmesartan-hydrochlorothiazide (BENICAR HCT) 40-12.5 mg per tablet, , Disp: , Rfl:    omeprazole (PRILOSEC) 40 MG capsule, Take by mouth., Disp: , Rfl:    senna (SENOKOT) 8.6 mg tablet, Take 1 tablet (8.6 mg total) by mouth., Disp: , Rfl:    tamoxifen  (NOLVADEX ) 20 MG tablet, , Disp: , Rfl:    Referring physician: A Referral Self No address on file  REVIEW OF SYSTEMS: No notes on file  ALLERGIES Allergies  Allergen Reactions   Codeine Nausea & Vomiting (ALLERGY/intolerance)    PAST MEDICAL HISTORY Past Medical History:  Diagnosis Date   Breast cancer ([facility]) 01/2000   Hypertension    Past  Surgical History:  Procedure Laterality Date   BREAST LUMPECTOMY Left 2018    FAMILY HISTORY Family History  Problem Relation Age of Onset   Diabetes Mother    Cancer Mother    Hypertension Mother    Diabetes Father    Cancer Father    Hypertension Father    Diabetes Sister    Glaucoma Sister    Hypertension Sister    Diabetes Maternal Grandmother    Cataracts Maternal Grandmother    Hypertension Maternal Grandmother    Stroke Brother    Hypertension Brother    Macular degeneration Neg Hx    Retinal detachment Neg Hx    Strabismus Neg Hx     SOCIAL HISTORY Social History   Tobacco Use   Smoking status: Never   Smokeless tobacco: Never  Substance Use Topics   Alcohol use: Yes    Alcohol/week: 1.0 standard drink    Types: 1 Glasses of wine per week    Comment: socially      OPHTHALMIC EXAM: Base Eye Exam    Visual Acuity (Snellen - Linear)      Right Left   Dist cc 20/25 20/20 -1   Near cc J2 J2   Correction: Glasses       Tonometry (tonopen, 8:46 AM)      Right Left   Pressure 19 17  Pupils      Pupils   Right PERRL   Left PERRL       Visual Fields      Left Right    Full Full       Extraocular Movement      Right Left    Full, Ortho Full, Ortho       Neuro/Psych    Oriented x3: Yes   Mood/Affect: Normal       Dilation    Both eyes: 1.0% Tropicamide (Mydriacyl) @ 8:47 AM  Open angles ou         Slit Lamp and Fundus Exam    External Exam      Right Left   External Normal Normal       Slit Lamp Exam      Right Left   Lids/Lashes Normal Normal   Conjunctiva/Sclera White and quiet White and quiet   Cornea Clear Clear   Anterior Chamber Deep and quiet Deep and quiet   Iris Round and reactive Round and reactive   Lens Clear Clear       Fundus Exam      Right Left   Vitreous Normal Normal   Disc Normal Normal   C/D Ratio 0.7 0.7   Macula Normal Normal   Vessels Normal Normal   Periphery Normal  Normal        Refraction    Wearing Rx      Sphere Cylinder Axis Add   Right -1.75 +1.50 175 +1.50   Left -1.25 +0.50 180 +1.50   Type: PAL       Manifest Refraction      Sphere Cylinder Axis Dist VA Add Near [place]   Right -1.75 +1.50 175 20/20 +2.00 J1   Left -1.25 +0.75 180 20/20- +2.00 J1       Final Rx      Sphere Cylinder Axis Dist VA Add Near [place]   Right -1.75 +1.50 175 20/20 +2.00 J1   Left -1.25 +0.75 180 20/20- +2.00 J1   Expiration Date: 04/04/2023        Contact Lens Exam    Current Contact Lens Rx      Brand Base Curve Diameter Sphere Cylinder Axis Centration Movement   Right Acuvue Oasys for Astig 8.6 14.5 +0.00 -1.25 090 Well-centered Adequate   Left Acuvue Oasys for Astig 8.6 14.5 -0.50 -0.75 090 Well-centered Adequate       Final Contact Lens Rx      Brand Base Curve Diameter Sphere Cylinder Axis   Right Acuvue Oasys for Astig 8.6 14.5 +0.00 -1.25 090   Left Acuvue Oasys for Astig 8.6 14.5 -0.50 -0.75 090   Expiration Date: 04/04/2023   Replacement: Every 2 weeks       Additional Notes   No changes made to contact lens rx today.           IMAGING AND PROCEDURES:      ASSESSMENT/PLAN:  1. Myopia with astigmatism and presbyopia, bilateral        Ophthalmic Meds Ordered this visit:  There were no meds ordered this visit.    Return for 1 year CE Routine and clck.  There are no Patient Instructions on file for this visit.    Copy of glasses RX given to patient. CL RX stable OU  Explained the diagnoses, plan, and follow up with the patient and they expressed understanding.  Patient expressed understanding of the importance of proper follow up care.  Abbreviations: M myopia (nearsighted); A astigmatism; H hyperopia (farsighted); P presbyopia; Mrx spectacle prescription;  CTL contact lenses; OD right eye; OS left eye; OU both eyes  XT exotropia; ET esotropia; PEK punctate epithelial keratitis; PEE punctate epithelial erosions; DES dry  eye syndrome; MGD meibomian gland dysfunction; ATs artificial tears; PFAT's preservative free artificial tears; NSC nuclear sclerotic cataract; PSC posterior subcapsular cataract; ERM epi-retinal membrane; PVD posterior vitreous detachment; RD retinal detachment; DM diabetes mellitus; DR diabetic retinopathy; NPDR non-proliferative diabetic retinopathy; PDR proliferative diabetic retinopathy; CSME clinically significant macular edema; DME diabetic macular edema; dbh dot blot hemorrhages; CWS cotton wool spot; POAG primary open angle glaucoma; C/D cup-to-disc ratio; HVF humphrey visual field; GVF goldmann visual field; OCT optical coherence tomography; IOP intraocular pressure; BRVO Branch retinal vein occlusion; CRVO central retinal vein occlusion; CRAO central retinal artery occlusion; BRAO branch retinal artery occlusion; RT retinal tear; SB scleral buckle; PPV pars plana vitrectomy; VH Vitreous hemorrhage; PRP panretinal laser photocoagulation; IVK intravitreal kenalog ; VMT vitreomacular traction; MH Macular hole;  NVD neovascularization of the disc; NVE neovascularization elsewhere; AREDS age related eye disease study; ARMD age related macular degeneration; POAG primary open angle glaucoma; EBMD epithelial/anterior basement membrane dystrophy; ACIOL anterior chamber intraocular lens; IOL intraocular lens; PCIOL posterior chamber intraocular lens; Phaco/IOL phacoemulsification with intraocular lens placement; PRK photorefractive keratectomy; LASIK laser assisted in situ keratomileusis; HTN hypertension; DM diabetes mellitus; COPD chronic obstructive pulmonary disease   This document serves as a record of services personally performed by Shelly ONEIDA Platts, MD.  It was created on their behalf by Gustav Stephane Acre, COA, a trained medical scribe, and Certified Ophthalmic Assistant (COA). During the course of documenting the history, physical exam and medical decision making, I was functioning as a stage manager.  The creation of this record is the providers dictation and/or activities during the visit.  Electronically signed by Gustav Stephane Acre, COA 04/03/2022 9:10 AM I agree the documentation is accurate and complete.  Electronically signed by: Shelly Debby Platts, MD 04/03/2022 9:12 AM      Electronically signed by: Shelly Debby Platts, MD 04/03/22 [phone]

## 2022-04-03 NOTE — Telephone Encounter (Signed)
Per 4/20 in basket called and spoke to pt about up coming appointments.  Pt confirmed appointments  ?

## 2022-04-03 NOTE — Telephone Encounter (Signed)
Per 4/20 in basket called and spoke to pt about upcoming appointments.  Pt confirmed appointments  ?

## 2022-05-13 ENCOUNTER — Telehealth: Payer: Self-pay | Admitting: Hematology and Oncology

## 2022-05-13 NOTE — Telephone Encounter (Signed)
Rescheduled appointment per provider template. Patient is aware of the changes made to her upcoming appointment 

## 2022-05-22 ENCOUNTER — Ambulatory Visit: Payer: BC Managed Care – PPO | Admitting: Orthopedic Surgery

## 2022-05-28 NOTE — Progress Notes (Signed)
Patient Care Team: Harlan Stains, MD as PCP - General (Family Medicine) [facility], MD as Consulting Physician (General Surgery) Magrinat, [name], MD (Inactive) as Consulting Physician (Oncology) [name], MD as Consulting Physician (Radiation Oncology) [name], MD as Consulting Physician (Orthopedic Surgery) [name] [name], NP as Nurse Practitioner (Hematology and Oncology) Aloha Gell, MD as Consulting Physician (Obstetrics and Gynecology)  DIAGNOSIS:  Encounter Diagnosis  Name Primary?   Malignant neoplasm of upper-outer quadrant of left breast in female, estrogen receptor positive ([place])     SUMMARY OF ONCOLOGIC HISTORY: Oncology History  Malignant neoplasm of upper-outer quadrant of left breast in female, estrogen receptor positive ([place])  01/26/2017 Initial Biopsy   Left breast upper outer quadrant biopsy: ILC, grade 1, ER +(100%), PR+(100%), Ki-67 70%, HER-2 negative (ratio 1.24).    02/03/2017 Initial Diagnosis   Malignant neoplasm of upper-outer quadrant of left breast in female, estrogen receptor positive ([place])   04/02/2017 Surgery   Left Lumpectomy and SLNB: ILC, 1.2 cm, grade 2, close margins, 3 SLN negative   04/02/2017 Oncotype testing   12/8%, low risk   04/16/2017 Surgery   Re-excision, margins negative   05/26/2017 - 07/02/2017 Radiation Therapy   Adjuvant radiation ([facility]): Left breast/ 1.8 Gy/fx, 28 txs, 50.4 Gy   08/2017 -  Anti-estrogen oral therapy   Tamoxifen daily     CHIEF COMPLIANT: Estrogen receptor positive breast cancer on Tamoxifen Establish Oncology care with Dr. Lindi Adie  INTERVAL HISTORY: Shelly Beasley is a 61 y.o. with the above mention Estrogen receptor positive breast cancer currently on Tamoxifen. She presents to the clinic for a follow-up. States that she has hot flashes. Denies vaginal dryness and mood swings. States that she did ok taking it. States that she aggravated a nerve in the left leg from running so she  is on crutches. She states that the outer part of her left leg hurts so she use the crutch for relief.   ALLERGIES:  is allergic to codeine and nexium [esomeprazole].  MEDICATIONS:  Current Outpatient Medications  Medication Sig Dispense Refill   olmesartan-hydrochlorothiazide (BENICAR HCT) 20-12.5 MG tablet Take 1 tablet by mouth daily.     tamoxifen (NOLVADEX) 20 MG tablet TAKE 1 TABLET BY MOUTH EVERY DAY 90 tablet 4   No current facility-administered medications for this visit.    PHYSICAL EXAMINATION: ECOG PERFORMANCE STATUS: 1 - Symptomatic but completely ambulatory  Vitals:   06/11/22 0828  BP: (!) 160/95  Pulse: 97  Resp: 19  Temp: (!) 97.5 F (36.4 C)  SpO2: 100%   Filed Weights   06/11/22 0828  Weight: 178 lb 6.4 oz (80.9 kg)    BREAST: No palpable masses or nodules in either right or left breasts. No palpable axillary supraclavicular or infraclavicular adenopathy no breast tenderness or nipple discharge. (exam performed in the presence of a chaperone)  LABORATORY DATA:  I have reviewed the data as listed    Latest Ref Rng & Units 06/11/2021    3:06 PM 03/28/2021    2:00 PM 06/05/2020    3:09 PM  CMP  Glucose 70 - 99 mg/dL 121  99  184   BUN 6 - 20 mg/dL _0 Creatinine 0.44 - 1.00 mg/dL 0.80  0.93  1.00   Sodium 135 - 145 mmol/L 139  141  137   Potassium 3.5 - 5.1 mmol/L 3.4  3.7  3.5   Chloride 98 - 111 mmol/L 103  106  102   CO2 22 - 32 mmol/L _0 Calcium 8.9 - 10.3 mg/dL 9.0  9.3  9.0   Total Protein 6.5 - 8.1 g/dL 6.5   6.6   Total Bilirubin 0.3 - 1.2 mg/dL 0.3   0.3   Alkaline Phos 38 - 126 U/L 60   62   AST 15 - 41 U/L 15   18   ALT 0 - 44 U/L 11   17     Lab Results  Component Value Date   WBC 11.3 (H) 06/11/2022   HGB 13.8 06/11/2022   HCT 41.4 06/11/2022   MCV 84.8 06/11/2022   PLT 213 06/11/2022   NEUTROABS 9.2 (H) 06/11/2022    ASSESSMENT & PLAN:  Malignant neoplasm of upper-outer quadrant of left breast in female,  estrogen receptor positive ([place])  left breast upper outer quadrant biopsy 01/26/2017, for a clinical T1b N0, stage IA invasive lobular carcinoma, grade 1, E-cadherin negative, estrogen and progesterone receptor positive, HER-2 not amplified, with an MIB-1 of 70%.    (1) Status post left lumpectomy and sentinel lymph node sampling 04/02/2017 for a pT1c pN0, stage IA invasive lobular carcinoma, grade 2, with close margins, which were improved with additional surgery 04/16/2017 no residual carcinoma)   (2) Oncotype DX score of 12 predicts a 10 year risk of outside the breast recurrence of 8%   (3) adjuvant radiation 05/26/2017 - 07/02/2017  Site/dose: Left breast/ 1.8 Gy/fx, 28 txs, 50.4 Gy     (4) started tamoxifen 08/15/2017   Tamoxifen Toxicities: Mild hot flashes otherwise tolerating it very well. We discussed the 5-year versus 10-year data with tamoxifen. I would like to send for breast cancer index to determine if she would benefit from extended adjuvant therapy. She had mentally made up her mind that she was can stop in September 2023.  However she will look at the [facility] and then make a decision.  I will call her with the result of this test.  Breast Cancer Surveillance: 1. Breast Exam: 06/11/22: Benign 2. Mammograms 02/03/22: Benign, density cat C  RTC in 1 year    No orders of the defined types were placed in this encounter.  The patient has a good understanding of the overall plan. she agrees with it. she will call with any problems that may develop before the next visit here. Total time spent: 30 mins including face to face time and time spent for planning, charting and co-ordination of care   Harriette Ohara, MD 06/11/22    I Gardiner Coins am scribing for Dr. Lindi Adie  I have reviewed the above documentation for accuracy and completeness, and I agree with the above.

## 2022-06-03 ENCOUNTER — Ambulatory Visit: Payer: BC Managed Care – PPO | Admitting: Orthopedic Surgery

## 2022-06-10 ENCOUNTER — Other Ambulatory Visit: Payer: Self-pay | Admitting: *Deleted

## 2022-06-10 DIAGNOSIS — C50412 Malignant neoplasm of upper-outer quadrant of left female breast: Secondary | ICD-10-CM

## 2022-06-10 NOTE — Assessment & Plan Note (Addendum)
left breast upper outer quadrant biopsy 01/26/2017, for a clinical T1b N0, stage IA invasive lobular carcinoma, grade 1, E-cadherin negative, estrogen and progesterone receptor positive, HER-2 not amplified, with an MIB-1 of 70%.   (1) Status post left lumpectomy and sentinel lymph node sampling 04/02/2017 for a pT1c pN0, stage IA invasive lobular carcinoma, grade 2, with close margins, which were improved with additional surgery 04/16/2017 no residual carcinoma)  (2) Oncotype DX score of 12 predicts a 10 year risk of outside the breast recurrence of 8%  (3) adjuvant radiation 05/26/2017 - 07/02/2017 Site/dose:Left breast/ 1.8 Gy/fx, 28 txs, 50.4 Gy   (4) started tamoxifen 08/15/2017  Tamoxifen Toxicities:  Breast Cancer Surveillance: 1. Breast Exam: 06/11/22: Benign 2. Mammograms 02/03/22: Benign, density cat C  RTC in 1 year

## 2022-06-11 ENCOUNTER — Inpatient Hospital Stay: Payer: BC Managed Care – PPO | Attending: Hematology and Oncology

## 2022-06-11 ENCOUNTER — Other Ambulatory Visit: Payer: Self-pay

## 2022-06-11 ENCOUNTER — Other Ambulatory Visit: Payer: BC Managed Care – PPO

## 2022-06-11 ENCOUNTER — Encounter: Payer: Self-pay | Admitting: *Deleted

## 2022-06-11 ENCOUNTER — Inpatient Hospital Stay (HOSPITAL_BASED_OUTPATIENT_CLINIC_OR_DEPARTMENT_OTHER): Payer: BC Managed Care – PPO | Admitting: Hematology and Oncology

## 2022-06-11 ENCOUNTER — Ambulatory Visit: Payer: BC Managed Care – PPO | Admitting: Hematology and Oncology

## 2022-06-11 DIAGNOSIS — Z923 Personal history of irradiation: Secondary | ICD-10-CM | POA: Diagnosis not present

## 2022-06-11 DIAGNOSIS — C50412 Malignant neoplasm of upper-outer quadrant of left female breast: Secondary | ICD-10-CM | POA: Diagnosis not present

## 2022-06-11 DIAGNOSIS — Z170 Estrogen receptor positive status [ER+]: Secondary | ICD-10-CM | POA: Insufficient documentation

## 2022-06-11 DIAGNOSIS — Z79899 Other long term (current) drug therapy: Secondary | ICD-10-CM | POA: Insufficient documentation

## 2022-06-11 DIAGNOSIS — Z79810 Long term (current) use of selective estrogen receptor modulators (SERMs): Secondary | ICD-10-CM | POA: Diagnosis not present

## 2022-06-11 LAB — CBC WITH DIFFERENTIAL (CANCER CENTER ONLY)
Abs Immature Granulocytes: 0.06 10*3/uL (ref 0.00–0.07)
Basophils Absolute: 0 10*3/uL (ref 0.0–0.1)
Basophils Relative: 0 %
Eosinophils Absolute: 0 10*3/uL (ref 0.0–0.5)
Eosinophils Relative: 0 %
HCT: 41.4 % (ref 36.0–46.0)
Hemoglobin: 13.8 g/dL (ref 12.0–15.0)
Immature Granulocytes: 1 %
Lymphocytes Relative: 14 %
Lymphs Abs: 1.6 10*3/uL (ref 0.7–4.0)
MCH: 28.3 pg (ref 26.0–34.0)
MCHC: 33.3 g/dL (ref 30.0–36.0)
MCV: 84.8 fL (ref 80.0–100.0)
Monocytes Absolute: 0.4 10*3/uL (ref 0.1–1.0)
Monocytes Relative: 4 %
Neutro Abs: 9.2 10*3/uL — ABNORMAL HIGH (ref 1.7–7.7)
Neutrophils Relative %: 81 %
Platelet Count: 213 10*3/uL (ref 150–400)
RBC: 4.88 MIL/uL (ref 3.87–5.11)
RDW: 13.3 % (ref 11.5–15.5)
WBC Count: 11.3 10*3/uL — ABNORMAL HIGH (ref 4.0–10.5)
nRBC: 0 % (ref 0.0–0.2)

## 2022-06-11 LAB — CMP (CANCER CENTER ONLY)
ALT: 13 U/L (ref 0–44)
AST: 14 U/L — ABNORMAL LOW (ref 15–41)
Albumin: 4.1 g/dL (ref 3.5–5.0)
Alkaline Phosphatase: 53 U/L (ref 38–126)
Anion gap: 10 (ref 5–15)
BUN: 15 mg/dL (ref 6–20)
CO2: 26 mmol/L (ref 22–32)
Calcium: 9.8 mg/dL (ref 8.9–10.3)
Chloride: 104 mmol/L (ref 98–111)
Creatinine: 0.9 mg/dL (ref 0.44–1.00)
GFR, Estimated: >60 mL/min (ref >60)
Glucose, Bld: 215 mg/dL — ABNORMAL HIGH (ref 70–99)
Potassium: 3.9 mmol/L (ref 3.5–5.1)
Sodium: 140 mmol/L (ref 135–145)
Total Bilirubin: 0.4 mg/dL (ref 0.3–1.2)
Total Protein: 6.8 g/dL (ref 6.5–8.1)

## 2022-06-11 NOTE — Progress Notes (Signed)
Per MD request RN successfully faxed [facility] testing request [phone]).

## 2022-06-18 ENCOUNTER — Encounter: Payer: Self-pay | Admitting: Family

## 2022-06-18 ENCOUNTER — Ambulatory Visit (INDEPENDENT_AMBULATORY_CARE_PROVIDER_SITE_OTHER): Payer: BC Managed Care – PPO

## 2022-06-18 ENCOUNTER — Ambulatory Visit: Payer: BC Managed Care – PPO | Admitting: Family

## 2022-06-18 DIAGNOSIS — M5416 Radiculopathy, lumbar region: Secondary | ICD-10-CM | POA: Diagnosis not present

## 2022-06-18 MED ORDER — PREDNISONE 10 MG PO TABS: 10.0000 mg | ORAL_TABLET | Freq: Every day | ORAL | 0 refills | Status: DC

## 2022-06-18 NOTE — Progress Notes (Signed)
Office Visit Note   Patient: Shelly Beasley           Date of Birth: 14-Jul-1961           MRN: [id number] Visit Date: 06/18/2022              Requested by: Harlan Stains, [address],  [place] [id number] PCP: Harlan Stains, MD  Chief Complaint  Patient presents with   Right Leg - Follow-up      HPI: The patient is a 61 year old female who presents complaining right lower extremity pain this radiates from her buttock down the lateral aspect of her thigh and lower leg this has been ongoing for about 2 weeks now  No associated numbness tingling or heaviness no weakness  No associated injury.  She did walk 6 miles which was unusual for her about 2 weeks ago she noted the next day this pain has come on she has had increased discomfort in bed or trying to sleep  She has been taking prednisone which has been easing her symptoms but has not completely alleviated that she took a course of 60 mg x 3 days 40 mg x 3 days etc.  Assessment & Plan: Visit Diagnoses:  1. Radiculopathy, lumbar region     Plan: We will send in low-dose prednisone.  We will refer her to Dr. Ernestina Patches for epidural steroid injection.  Hope this can get done before her vacation which begins on july 19  Follow-Up Instructions: No follow-ups on file.   Back Exam   Tenderness  The patient is experiencing no tenderness.   Muscle Strength  The patient has normal back strength.  Tests  Straight leg raise right: positive Straight leg raise left: negative      Patient is alert, oriented, no adenopathy, well-dressed, normal affect, normal respiratory effort.   Imaging: No results found. No images are attached to the encounter.  Labs: No results found for: "HGBA1C", "ESRSEDRATE", "CRP", "LABURIC", "REPTSTATUS", "GRAMSTAIN", "CULT", "LABORGA"   Lab Results  Component Value Date   ALBUMIN 4.1 06/11/2022   ALBUMIN 3.4 (L) 06/11/2021   ALBUMIN 3.5 06/05/2020    No results  found for: "MG" No results found for: "VD25OH"  No results found for: "PREALBUMIN"    Latest Ref Rng & Units 06/11/2022    8:06 AM 06/11/2021    3:06 PM 03/28/2021    2:00 PM  CBC EXTENDED  WBC 4.0 - 10.5 K/uL 11.3  4.8  4.5   RBC 3.87 - 5.11 MIL/uL 4.88  4.39  4.67   Hemoglobin 12.0 - 15.0 g/dL 13.8  12.4  13.2   HCT 36.0 - 46.0 % 41.4  37.3  41.1   Platelets 150 - 400 K/uL 213  184  217   NEUT# 1.7 - 7.7 K/uL 9.2  1.6    Lymph# 0.7 - 4.0 K/uL 1.6  2.5       There is no height or weight on file to calculate BMI.  Orders:  Orders Placed This Encounter  Procedures   XR Lumbar Spine 2-3 Views   No orders of the defined types were placed in this encounter.    Procedures: No procedures performed  Clinical Data: No additional findings.  ROS:  All other systems negative, except as noted in the HPI. Review of Systems  Musculoskeletal:  Positive for back pain. Negative for arthralgias.  Neurological:  Negative for weakness and numbness.    Objective: Vital Signs: There  were no vitals taken for this visit.  Specialty Comments:  No specialty comments available.  PMFS History: Patient Active Problem List   Diagnosis Date Noted   Chronic benign neutropenia ([place]) 06/05/2020   Malignant neoplasm of upper-outer quadrant of left breast in female, estrogen receptor positive ([place]) 02/03/2017   Past Medical History:  Diagnosis Date   Acid reflux    takes Omeprazole as needed   Breast cancer ([place])    Cancer ([place]) 02/2017   left breast cancer   History of radiation therapy 05/26/17-07/02/17   left breast 50.4 Gy   Hypertension    Personal history of radiation therapy     Family History  Problem Relation Age of Onset   Multiple myeloma Mother    Lymphoma Father     Past Surgical History:  Procedure Laterality Date   BREAST BIOPSY     BREAST LUMPECTOMY Left    2018   BREAST LUMPECTOMY WITH RADIOACTIVE SEED AND SENTINEL LYMPH NODE BIOPSY Left 04/02/2017   Procedure:  LEFT BREAST LUMPECTOMY WITH RADIOACTIVE SEED AND SENTINEL LYMPH NODE BIOPSY;  Surgeon: [facility], MD;  Location: [facility];  Service: General;  Laterality: Left;   BUNIONECTOMY Bilateral    ENDOMETRIAL ABLATION     RE-EXCISION OF BREAST LUMPECTOMY Left 04/16/2017   Procedure: RE-EXCISION OF BREAST LUMPECTOMY;  Surgeon: [facility], MD;  Location: [facility];  Service: General;  Laterality: Left;   Social History   Occupational History   Not on file  Tobacco Use   Smoking status: Never   Smokeless tobacco: Never  Vaping Use   Vaping Use: Never used  Substance and Sexual Activity   Alcohol use: Yes    Comment: social   Drug use: No   Sexual activity: Not on file    Comment: endometrial ablation

## 2022-06-24 ENCOUNTER — Encounter: Payer: Self-pay | Admitting: *Deleted

## 2022-06-24 NOTE — Progress Notes (Signed)
Received fax from [facility] testing stating pt has declined to proceed with [facility] testing at this time.  MD notified and verbalized understanding.

## 2022-07-16 ENCOUNTER — Encounter: Payer: BC Managed Care – PPO | Admitting: Physical Medicine and Rehabilitation

## 2022-08-03 ENCOUNTER — Other Ambulatory Visit: Payer: Self-pay | Admitting: Family

## 2022-08-30 ENCOUNTER — Other Ambulatory Visit: Payer: Self-pay | Admitting: Adult Health

## 2022-09-14 IMAGING — MG DIGITAL DIAGNOSTIC BILAT W/ TOMO W/ CAD
6 of 11 series · 6 of 31 positions shown · non-contrast
Comparison: Previous exam(s).

CLINICAL DATA: 60-year-old female status post malignant left
lumpectomy in 6383.

EXAM:
DIGITAL DIAGNOSTIC BILATERAL MAMMOGRAM WITH TOMOSYNTHESIS AND CAD
TECHNIQUE: Bilateral digital diagnostic mammography and breast tomosynthesis
was performed. The images were evaluated with computer-aided
detection.

[L MLO]
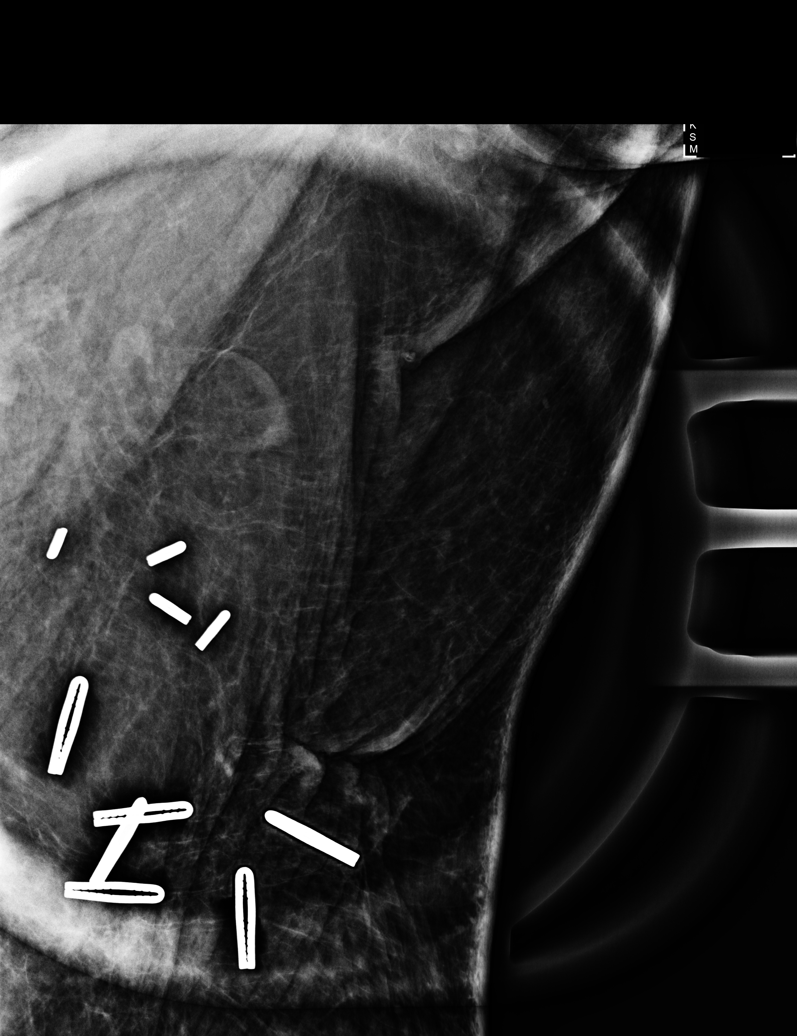

[L MLO synth-2D]
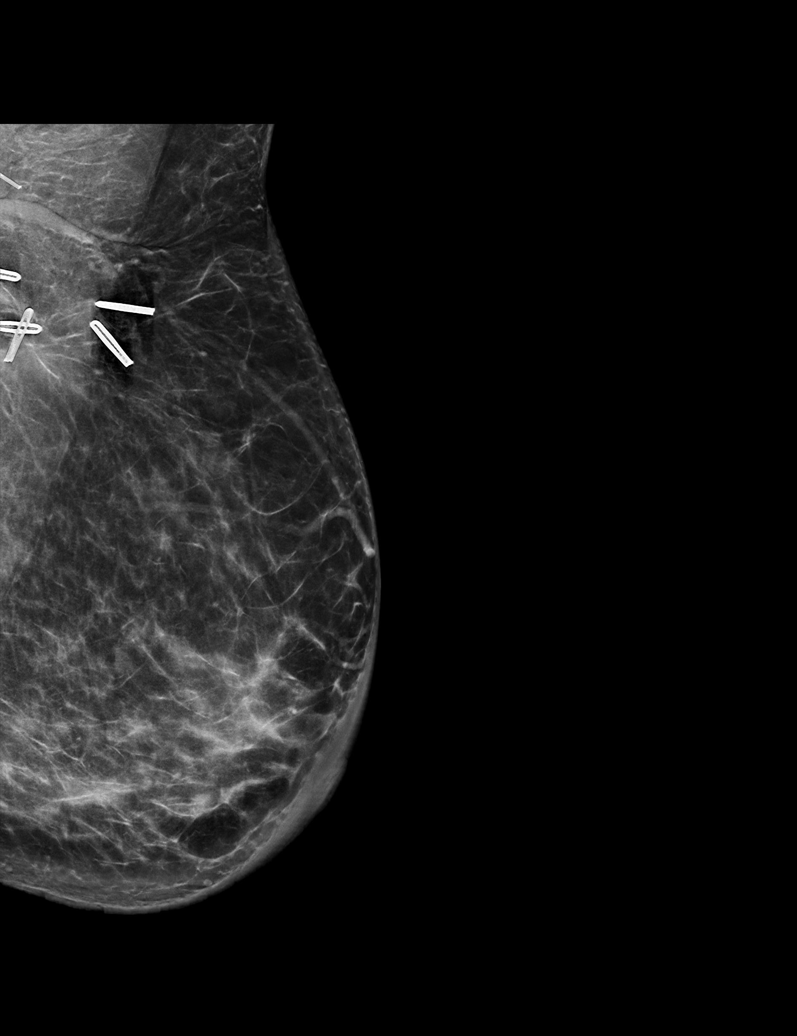

[L CC synth-2D]
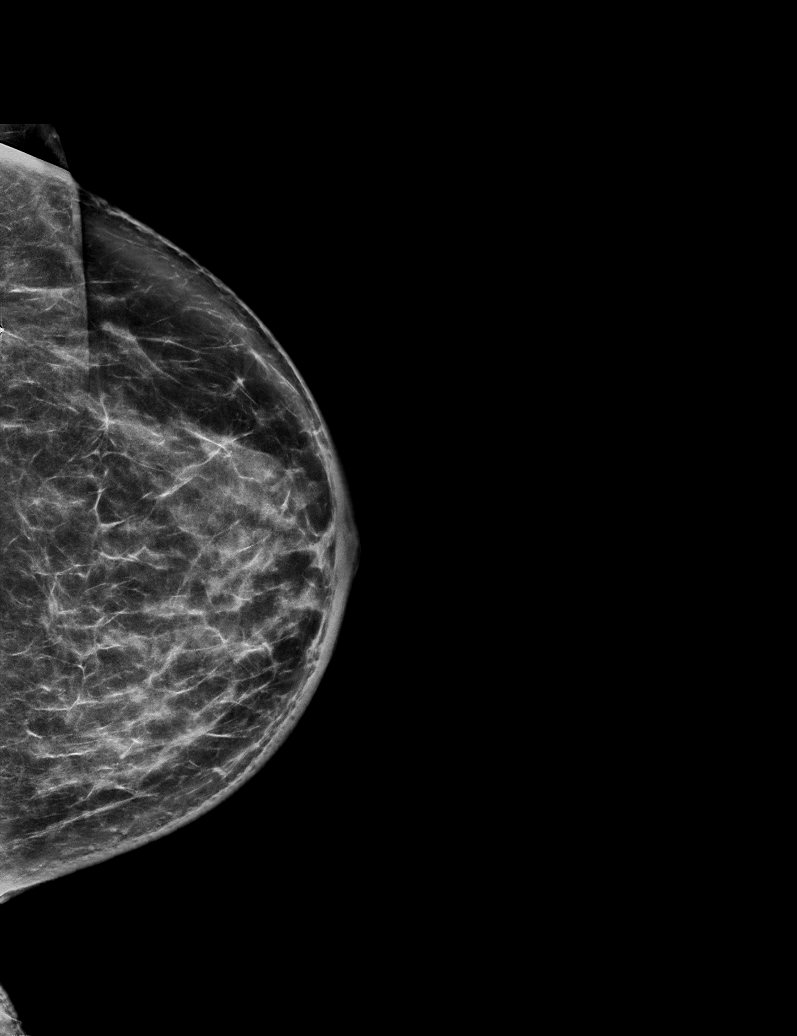

[R MLO synth-2D]
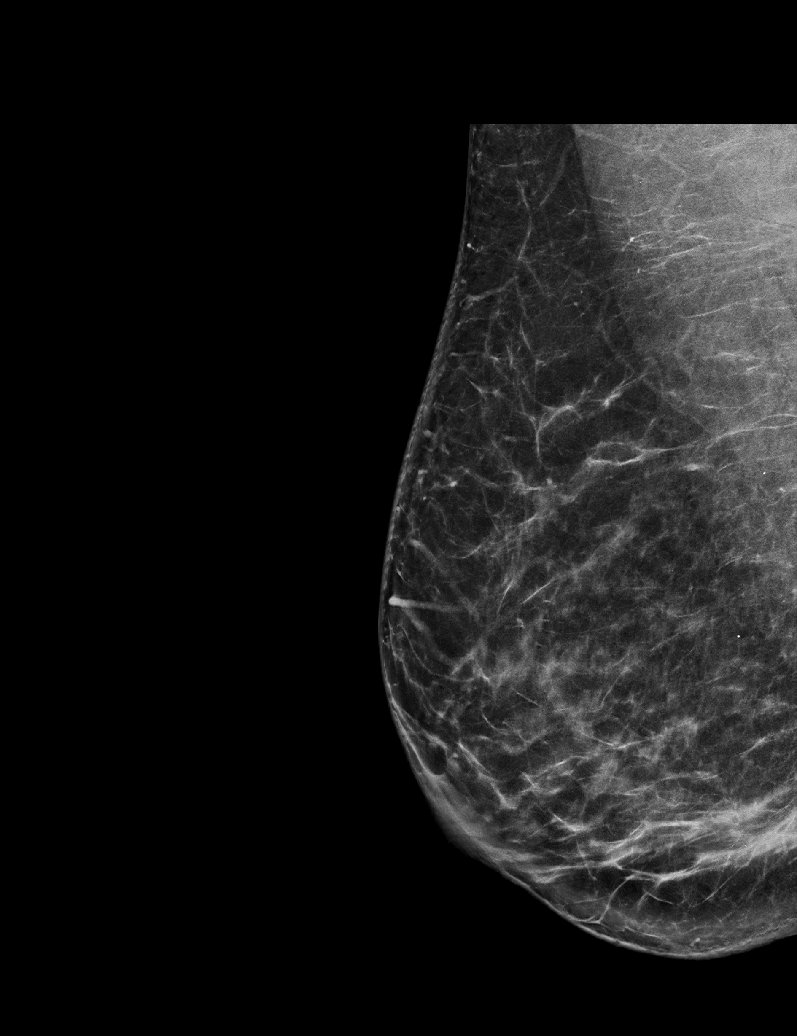

[L XCCL synth-2D]
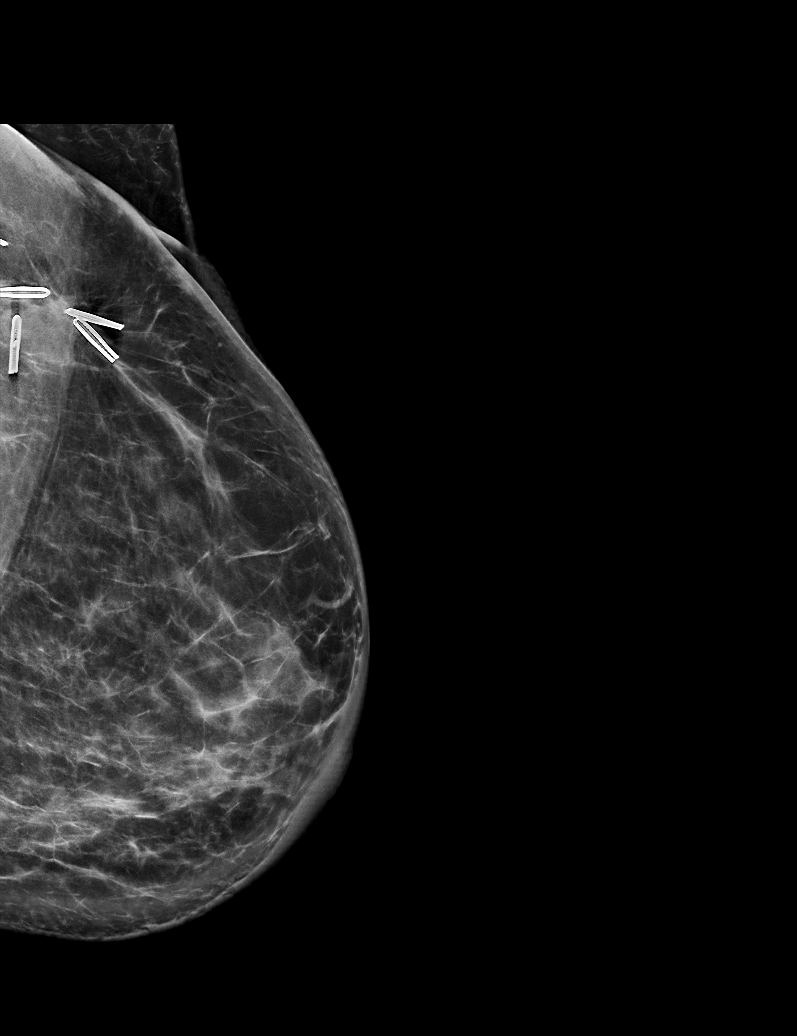

[R CC synth-2D]
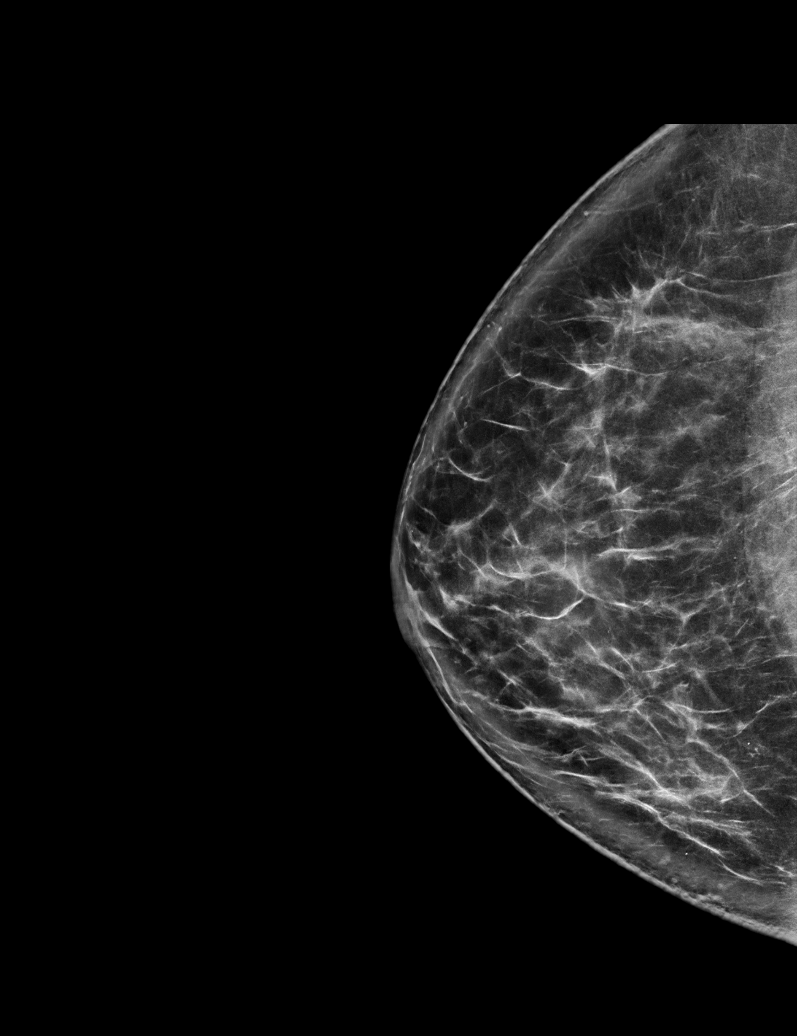

[6 of 31 positions shown; findings below may reference images not displayed]

ACR Breast Density Category c: The breast tissue is heterogeneously
dense, which may obscure small masses.
FINDINGS: Stable post lumpectomy changes are again noted in the far posterior
upper outer quadrant of the left breast. No new or suspicious
findings in either breast. The parenchymal pattern is stable.
IMPRESSION: 1. No mammographic evidence of malignancy in either breast.
2. Stable benign left breast posttreatment changes.

RECOMMENDATION:
Screening mammogram in one year.(Code:[id number])

I have discussed the findings and recommendations with the patient.
If applicable, a reminder letter will be sent to the patient
regarding the next appointment.

BI-RADS CATEGORY  2: Benign.

## 2022-09-17 ENCOUNTER — Other Ambulatory Visit: Payer: Self-pay | Admitting: Orthopedic Surgery

## 2022-12-26 ENCOUNTER — Other Ambulatory Visit: Payer: Self-pay | Admitting: Family Medicine

## 2022-12-26 DIAGNOSIS — Z1231 Encounter for screening mammogram for malignant neoplasm of breast: Secondary | ICD-10-CM

## 2023-02-16 ENCOUNTER — Ambulatory Visit: Payer: BC Managed Care – PPO

## 2023-03-25 ENCOUNTER — Ambulatory Visit
Admission: RE | Admit: 2023-03-25 | Discharge: 2023-03-25 | Disposition: A | Discharge status: Home / Self Care | Payer: BC Managed Care – PPO | Source: Ambulatory Visit | Attending: Family Medicine | Admitting: Family Medicine

## 2023-03-25 DIAGNOSIS — Z1231 Encounter for screening mammogram for malignant neoplasm of breast: Secondary | ICD-10-CM

## 2023-06-13 NOTE — Progress Notes (Signed)
Patient Care Team: Laurann Montana, MD as PCP - General (Family Medicine) Almond Lint, MD as Consulting Physician (General Surgery) Magrinat, Valentino Hue, MD (Inactive) as Consulting Physician (Oncology) Antony Blackbird, MD as Consulting Physician (Radiation Oncology) Francena Hanly, MD as Consulting Physician (Orthopedic Surgery) Axel Filler Larna Daughters, NP as Nurse Practitioner (Hematology and Oncology) Noland Fordyce, MD as Consulting Physician (Obstetrics and Gynecology)  DIAGNOSIS: No diagnosis found.  SUMMARY OF ONCOLOGIC HISTORY: Oncology History  Malignant neoplasm of upper-outer quadrant of left breast in female, estrogen receptor positive ([facility])  01/26/2017 Initial Biopsy   Left breast upper outer quadrant biopsy: ILC, grade 1, ER +(100%), PR+(100%), Ki-67 70%, HER-2 negative (ratio 1.24).    02/03/2017 Initial Diagnosis   Malignant neoplasm of upper-outer quadrant of left breast in female, estrogen receptor positive ([facility])   04/02/2017 Surgery   Left Lumpectomy and SLNB: ILC, 1.2 cm, grade 2, close margins, 3 SLN negative   04/02/2017 Oncotype testing   12/8%, low risk   04/16/2017 Surgery   Re-excision, margins negative   05/26/2017 - 07/02/2017 Radiation Therapy   Adjuvant radiation ([facility]): Left breast/ 1.8 Gy/fx, 28 txs, 50.4 Gy   08/2017 -  Anti-estrogen oral therapy   Tamoxifen daily     CHIEF COMPLIANT: Follow-up on Tamoxifen   INTERVAL HISTORY: Shelly Beasley is a 62 y.o. with the above mention Estrogen receptor positive breast cancer currently on Tamoxifen. She presents to the clinic for a follow-up.    ALLERGIES:  is allergic to codeine and nexium [esomeprazole].  MEDICATIONS:  Current Outpatient Medications  Medication Sig Dispense Refill   olmesartan-hydrochlorothiazide (BENICAR HCT) 20-12.5 MG tablet Take 1 tablet by mouth daily.     predniSONE (DELTASONE) 10 MG tablet TAKE 1 TABLET (10 MG TOTAL) BY MOUTH DAILY WITH BREAKFAST. 40 tablet 0   tamoxifen  (NOLVADEX) 20 MG tablet TAKE 1 TABLET BY MOUTH EVERY DAY 90 tablet 4   No current facility-administered medications for this visit.    PHYSICAL EXAMINATION: ECOG PERFORMANCE STATUS: {CHL ONC ECOG PS:[phone]}  There were no vitals filed for this visit. There were no vitals filed for this visit.  BREAST:*** No palpable masses or nodules in either right or left breasts. No palpable axillary supraclavicular or infraclavicular adenopathy no breast tenderness or nipple discharge. (exam performed in the presence of a chaperone)  LABORATORY DATA:  I have reviewed the data as listed    Latest Ref Rng & Units 06/11/2022    8:06 AM 06/11/2021    3:06 PM 03/28/2021    2:00 PM  CMP  Glucose 70 - 99 mg/dL 161  096  99   BUN 6 - 20 mg/dL 15  11  13    Creatinine 0.44 - 1.00 mg/dL 0.45  4.09  8.11   Sodium 135 - 145 mmol/L 140  139  141   Potassium 3.5 - 5.1 mmol/L 3.9  3.4  3.7   Chloride 98 - 111 mmol/L 104  103  106   CO2 22 - 32 mmol/L 26  27  26    Calcium 8.9 - 10.3 mg/dL 9.8  9.0  9.3   Total Protein 6.5 - 8.1 g/dL 6.8  6.5    Total Bilirubin 0.3 - 1.2 mg/dL 0.4  0.3    Alkaline Phos 38 - 126 U/L 53  60    AST 15 - 41 U/L 14  15    ALT 0 - 44 U/L 13  11      Lab Results  Component Value  Date   WBC 11.3 (H) 06/11/2022   HGB 13.8 06/11/2022   HCT 41.4 06/11/2022   MCV 84.8 06/11/2022   PLT 213 06/11/2022   NEUTROABS 9.2 (H) 06/11/2022    ASSESSMENT & PLAN:  No problem-specific Assessment & Plan notes found for this encounter.    No orders of the defined types were placed in this encounter.  The patient has a good understanding of the overall plan. she agrees with it. she will call with any problems that may develop before the next visit here. Total time spent: 30 mins including face to face time and time spent for planning, charting and co-ordination of care   Sherlyn Lick, CMA 06/13/23    I Janan Ridge am acting as a Neurosurgeon for [employer]  ***

## 2023-06-15 ENCOUNTER — Other Ambulatory Visit: Payer: Self-pay

## 2023-06-15 ENCOUNTER — Inpatient Hospital Stay: Payer: BC Managed Care – PPO | Attending: Hematology and Oncology | Admitting: Hematology and Oncology

## 2023-06-15 VITALS — BP 152/74 | HR 95 | Temp 97.7°F | Resp 18 | Ht 65.0 in | Wt 178.8 lb

## 2023-06-15 DIAGNOSIS — C50412 Malignant neoplasm of upper-outer quadrant of left female breast: Secondary | ICD-10-CM | POA: Insufficient documentation

## 2023-06-15 DIAGNOSIS — Z170 Estrogen receptor positive status [ER+]: Secondary | ICD-10-CM | POA: Insufficient documentation

## 2023-06-15 DIAGNOSIS — Z923 Personal history of irradiation: Secondary | ICD-10-CM | POA: Diagnosis not present

## 2023-06-15 DIAGNOSIS — Z79810 Long term (current) use of selective estrogen receptor modulators (SERMs): Secondary | ICD-10-CM | POA: Diagnosis not present

## 2023-06-15 DIAGNOSIS — Z79899 Other long term (current) drug therapy: Secondary | ICD-10-CM | POA: Insufficient documentation

## 2023-06-15 NOTE — Assessment & Plan Note (Addendum)
left breast upper outer quadrant biopsy 01/26/2017, for a clinical T1b N0, stage IA invasive lobular carcinoma, grade 1, E-cadherin negative, estrogen and progesterone receptor positive, HER-2 not amplified, with an MIB-1 of 70%.    (1) Status post left lumpectomy and sentinel lymph node sampling 04/02/2017 for a pT1c pN0, stage IA invasive lobular carcinoma, grade 2, with close margins, which were improved with additional surgery 04/16/2017 no residual carcinoma)   (2) Oncotype DX score of 12 predicts a 10 year risk of outside the breast recurrence of 8%   (3) adjuvant radiation 05/26/2017 - 07/02/2017  Site/dose: Left breast/ 1.8 Gy/fx, 28 txs, 50.4 Gy     (4) started tamoxifen 08/15/2017   Tamoxifen Toxicities: Mild hot flashes otherwise tolerating it very well. Patient is okay with continuing tamoxifen for 10 years Patient declined BCI testing    Breast Cancer Surveillance: 1. Breast Exam: 06/15/2023: Benign 2. Mammograms 03/26/2023: Benign, density cat C   RTC in 1 year

## 2023-11-25 ENCOUNTER — Other Ambulatory Visit: Payer: Self-pay | Admitting: Adult Health

## 2023-11-26 ENCOUNTER — Other Ambulatory Visit (HOSPITAL_COMMUNITY): Payer: Self-pay | Admitting: Family Medicine

## 2023-11-26 DIAGNOSIS — E1169 Type 2 diabetes mellitus with other specified complication: Secondary | ICD-10-CM

## 2023-11-26 DIAGNOSIS — E785 Hyperlipidemia, unspecified: Secondary | ICD-10-CM

## 2024-03-29 ENCOUNTER — Other Ambulatory Visit: Payer: Self-pay | Admitting: Family Medicine

## 2024-03-29 DIAGNOSIS — Z1231 Encounter for screening mammogram for malignant neoplasm of breast: Secondary | ICD-10-CM

## 2024-03-30 ENCOUNTER — Ambulatory Visit
Admission: RE | Admit: 2024-03-30 | Discharge: 2024-03-30 | Disposition: A | Discharge status: Home / Self Care | Source: Ambulatory Visit | Attending: Family Medicine | Admitting: Family Medicine

## 2024-03-30 DIAGNOSIS — Z1231 Encounter for screening mammogram for malignant neoplasm of breast: Secondary | ICD-10-CM

## 2024-06-13 NOTE — Assessment & Plan Note (Signed)
 left breast upper outer quadrant biopsy 01/26/2017, for a clinical T1b N0, stage IA invasive lobular carcinoma, grade 1, E-cadherin negative, estrogen and progesterone receptor positive, HER-2 not amplified, with an MIB-1 of 70%.    (1) Status post left lumpectomy and sentinel lymph node sampling 04/02/2017 for a pT1c pN0, stage IA invasive lobular carcinoma, grade 2, with close margins, which were improved with additional surgery 04/16/2017 no residual carcinoma)   (2) Oncotype DX score of 12 predicts a 10 year risk of outside the breast recurrence of 8%   (3) adjuvant radiation 05/26/2017 - 07/02/2017  Site/dose: Left breast/ 1.8 Gy/fx, 28 txs, 50.4 Gy     (4) started tamoxifen  08/15/2017   Tamoxifen  Toxicities: Mild hot flashes otherwise tolerating it very well. Patient is okay with continuing tamoxifen  for 10 years Patient declined BCI testing    Breast Cancer Surveillance: 1. Breast Exam: 06/15/2023: Benign 2. Mammograms 03/31/2024: Benign, density cat C   RTC in 1 year

## 2024-06-14 ENCOUNTER — Inpatient Hospital Stay: Payer: BC Managed Care – PPO | Attending: Hematology and Oncology | Admitting: Hematology and Oncology

## 2024-06-14 VITALS — BP 130/79 | HR 70 | Temp 98.4°F | Resp 16 | Wt 176.0 lb

## 2024-06-14 DIAGNOSIS — Z1731 Human epidermal growth factor receptor 2 positive status: Secondary | ICD-10-CM | POA: Diagnosis not present

## 2024-06-14 DIAGNOSIS — Z1721 Progesterone receptor positive status: Secondary | ICD-10-CM | POA: Diagnosis not present

## 2024-06-14 DIAGNOSIS — Z79810 Long term (current) use of selective estrogen receptor modulators (SERMs): Secondary | ICD-10-CM | POA: Diagnosis not present

## 2024-06-14 DIAGNOSIS — Z170 Estrogen receptor positive status [ER+]: Secondary | ICD-10-CM | POA: Diagnosis not present

## 2024-06-14 DIAGNOSIS — C50412 Malignant neoplasm of upper-outer quadrant of left female breast: Secondary | ICD-10-CM | POA: Insufficient documentation

## 2024-06-14 DIAGNOSIS — Z923 Personal history of irradiation: Secondary | ICD-10-CM | POA: Diagnosis not present

## 2024-06-14 MED ORDER — VITAMIN D 25 MCG (1000 UNIT) PO TABS: 1000.0000 [IU] | ORAL_TABLET | Freq: Every day | ORAL | Status: AC

## 2024-06-14 NOTE — Progress Notes (Signed)
 Patient Care Team: Teresa Channel, MD as PCP - General (Family Medicine) Aron Shoulders, MD as Consulting Physician (General Surgery) Shannon Agent, MD as Consulting Physician (Radiation Oncology) Melita Drivers, MD as Consulting Physician (Orthopedic Surgery) Crawford Morna Pickle, NP as Nurse Practitioner (Hematology and Oncology) Kandyce Sor, MD as Consulting Physician (Obstetrics and Gynecology) Odean Potts, MD as Consulting Physician (Hematology and Oncology)  DIAGNOSIS:  Encounter Diagnosis  Name Primary?   Malignant neoplasm of upper-outer quadrant of left breast in female, estrogen receptor positive ([facility]) Yes    SUMMARY OF ONCOLOGIC HISTORY: Oncology History  Malignant neoplasm of upper-outer quadrant of left breast in female, estrogen receptor positive ([facility])  01/26/2017 Initial Biopsy   Left breast upper outer quadrant biopsy: ILC, grade 1, ER +(100%), PR+(100%), Ki-67 70%, HER-2 negative (ratio 1.24).    02/03/2017 Initial Diagnosis   Malignant neoplasm of upper-outer quadrant of left breast in female, estrogen receptor positive ([facility])   04/02/2017 Surgery   Left Lumpectomy and SLNB: ILC, 1.2 cm, grade 2, close margins, 3 SLN negative   04/02/2017 Oncotype testing   12/8%, low risk   04/16/2017 Surgery   Re-excision, margins negative   05/26/2017 - 07/02/2017 Radiation Therapy   Adjuvant radiation ([facility]): Left breast/ 1.8 Gy/fx, 28 txs, 50.4 Gy   08/2017 -  Anti-estrogen oral therapy   Tamoxifen  daily     CHIEF COMPLIANT: Surveillance of breast cancer  HISTORY OF PRESENT ILLNESS:   History of Present Illness Shelly Beasley is a 63 year old female with breast cancer who presents for a routine follow-up visit.  She was diagnosed with breast cancer 10 and a half years ago and is currently on tamoxifen  without significant side effects. Recent mammograms in April were normal with some breast density, but imaging remains effective with 3D mammograms. She takes  tamoxifen , a blood pressure medication, and vitamin D 1000 IU. She experiences no hot flashes, chest pain, or discomfort.     ALLERGIES:  is allergic to codeine and nexium [esomeprazole].  MEDICATIONS:  Current Outpatient Medications  Medication Sig Dispense Refill   olmesartan-hydrochlorothiazide (BENICAR HCT) 20-12.5 MG tablet Take 1 tablet by mouth daily.     predniSONE  (DELTASONE ) 10 MG tablet TAKE 1 TABLET (10 MG TOTAL) BY MOUTH DAILY WITH BREAKFAST. 40 tablet 0   tamoxifen  (NOLVADEX ) 20 MG tablet TAKE 1 TABLET BY MOUTH EVERY DAY 90 tablet 4   No current facility-administered medications for this visit.    PHYSICAL EXAMINATION: ECOG PERFORMANCE STATUS: 1 - Symptomatic but completely ambulatory  Vitals:   06/14/24 0822  BP: 130/79  Pulse: 70  Resp: 16  Temp: 98.4 F (36.9 C)  SpO2: 99%   Filed Weights   06/14/24 0822  Weight: 176 lb (79.8 kg)   Breast exam: Benign  LABORATORY DATA:  I have reviewed the data as listed    Latest Ref Rng & Units 06/11/2022    8:06 AM 06/11/2021    3:06 PM 03/28/2021    2:00 PM  CMP  Glucose 70 - 99 mg/dL 784  878  99   BUN 6 - 20 mg/dL 15  11  13    Creatinine 0.44 - 1.00 mg/dL 9.09  9.19  9.06   Sodium 135 - 145 mmol/L 140  139  141   Potassium 3.5 - 5.1 mmol/L 3.9  3.4  3.7   Chloride 98 - 111 mmol/L 104  103  106   CO2 22 - 32 mmol/L 26  27  26  Calcium 8.9 - 10.3 mg/dL 9.8  9.0  9.3   Total Protein 6.5 - 8.1 g/dL 6.8  6.5    Total Bilirubin 0.3 - 1.2 mg/dL 0.4  0.3    Alkaline Phos 38 - 126 U/L 53  60    AST 15 - 41 U/L 14  15    ALT 0 - 44 U/L 13  11      Lab Results  Component Value Date   WBC 11.3 (H) 06/11/2022   HGB 13.8 06/11/2022   HCT 41.4 06/11/2022   MCV 84.8 06/11/2022   PLT 213 06/11/2022   NEUTROABS 9.2 (H) 06/11/2022    ASSESSMENT & PLAN:  Malignant neoplasm of upper-outer quadrant of left breast in female, estrogen receptor positive ([facility])  left breast upper outer quadrant biopsy 01/26/2017, for a  clinical T1b N0, stage IA invasive lobular carcinoma, grade 1, E-cadherin negative, estrogen and progesterone receptor positive, HER-2 not amplified, with an MIB-1 of 70%.    (1) Status post left lumpectomy and sentinel lymph node sampling 04/02/2017 for a pT1c pN0, stage IA invasive lobular carcinoma, grade 2, with close margins, which were improved with additional surgery 04/16/2017 no residual carcinoma)   (2) Oncotype DX score of 12 predicts a 10 year risk of outside the breast recurrence of 8%   (3) adjuvant radiation 05/26/2017 - 07/02/2017  Site/dose: Left breast/ 1.8 Gy/fx, 28 txs, 50.4 Gy     (4) started tamoxifen  08/15/2017   Tamoxifen  Toxicities: Mild hot flashes otherwise tolerating it very well. Patient is okay with continuing tamoxifen  for 10 years Patient declined BCI testing    Breast Cancer Surveillance: 1. Breast Exam: 06/14/2024: Benign 2. Mammograms 03/31/2024: Benign, density cat C   RTC in 1 year     No orders of the defined types were placed in this encounter.  The patient has a good understanding of the overall plan. she agrees with it. she will call with any problems that may develop before the next visit here. Total time spent: 30 mins including face to face time and time spent for planning, charting and co-ordination of care   Viinay K Bralen Wiltgen, MD 06/14/24

## 2024-12-26 ENCOUNTER — Other Ambulatory Visit: Payer: Self-pay | Admitting: Family Medicine

## 2024-12-26 DIAGNOSIS — Z1231 Encounter for screening mammogram for malignant neoplasm of breast: Secondary | ICD-10-CM

## 2025-03-31 ENCOUNTER — Ambulatory Visit

## 2025-06-14 ENCOUNTER — Ambulatory Visit: Admitting: Hematology and Oncology
